# Patient Record
Sex: Male | Born: 1980
Health system: Southern US, Community
[De-identification: ages and names within clinical notes are randomized; demographics above are authoritative.]

## PROBLEM LIST (undated history)

## (undated) DIAGNOSIS — Z973 Presence of spectacles and contact lenses: Secondary | ICD-10-CM

## (undated) DIAGNOSIS — I10 Essential (primary) hypertension: Secondary | ICD-10-CM

## (undated) DIAGNOSIS — G4700 Insomnia, unspecified: Secondary | ICD-10-CM

## (undated) HISTORY — PX: MANDIBLE FRACTURE SURGERY: SHX706

## (undated) HISTORY — DX: Insomnia, unspecified: G47.00

## (undated) HISTORY — DX: Essential (primary) hypertension: I10

## (undated) HISTORY — DX: Presence of spectacles and contact lenses: Z97.3

## (undated) HISTORY — PX: KNEE SURGERY: SHX244

---

## 2005-08-04 ENCOUNTER — Emergency Department (HOSPITAL_COMMUNITY): Admission: EM | Admit: 2005-08-04 | Discharge: 2005-08-04 | Payer: Self-pay | Admitting: Emergency Medicine

## 2005-08-06 ENCOUNTER — Ambulatory Visit (HOSPITAL_BASED_OUTPATIENT_CLINIC_OR_DEPARTMENT_OTHER): Admission: RE | Admit: 2005-08-06 | Discharge: 2005-08-06 | Payer: Self-pay | Admitting: *Deleted

## 2007-04-11 ENCOUNTER — Emergency Department (HOSPITAL_COMMUNITY): Admission: EM | Admit: 2007-04-11 | Discharge: 2007-04-11 | Payer: Self-pay | Admitting: Emergency Medicine

## 2008-02-07 ENCOUNTER — Emergency Department (HOSPITAL_COMMUNITY): Admission: EM | Admit: 2008-02-07 | Discharge: 2008-02-07 | Payer: Self-pay | Admitting: Emergency Medicine

## 2008-02-26 ENCOUNTER — Ambulatory Visit (HOSPITAL_BASED_OUTPATIENT_CLINIC_OR_DEPARTMENT_OTHER): Admission: RE | Admit: 2008-02-26 | Discharge: 2008-02-26 | Payer: Self-pay | Admitting: Orthopedic Surgery

## 2009-10-10 ENCOUNTER — Emergency Department (HOSPITAL_COMMUNITY): Admission: EM | Admit: 2009-10-10 | Discharge: 2009-10-10 | Payer: Self-pay | Admitting: Emergency Medicine

## 2009-10-29 ENCOUNTER — Emergency Department (HOSPITAL_COMMUNITY): Admission: EM | Admit: 2009-10-29 | Discharge: 2009-10-29 | Payer: Self-pay | Admitting: Emergency Medicine

## 2010-07-23 ENCOUNTER — Encounter: Payer: Self-pay | Admitting: Orthopedic Surgery

## 2010-11-13 NOTE — Op Note (Signed)
Adrian Hess, Adrian Hess                 ACCOUNT NO.:  [id number]   MEDICAL RECORD NO.:  [id number]          PATIENT TYPE:  AMB   LOCATION:  DSC                          FACILITY:  [facility]   PHYSICIAN:  Eulas Post, MD    DATE OF BIRTH:  1981-02-26   DATE OF PROCEDURE:  02/26/2008  DATE OF DISCHARGE:                               OPERATIVE REPORT   PREOPERATIVE DIAGNOSIS:  Left knee medial meniscus tear.   POSTOPERATIVE DIAGNOSIS:  Left knee medial meniscus tear.   OPERATIVE PROCEDURE:  Left knee arthroscopy with partial medial  meniscectomy.   PREOPERATIVE INDICATIONS:  Mr. Adrian Hess is a 30 year old man who tore  his medial meniscus while going into a deep squatted position while  working at [employer].  He presented with an acute bucket-handle meniscus  tear.  He elected to undergo the above-named procedure.  The risks,  benefits, and alternatives including not limited to risks of infection,  bleeding, nerve injury, incomplete healing of the meniscus, recurrent  meniscal tear, cardiopulmonary complications, progression of arthritis,  knee stiffness, loss of function, among others, and he is willing to  proceed.  We had a long discussion about meniscal repair versus  resection and I counseled him that we would do whenever was most  appropriate given the findings of his meniscus intraoperatively.   OPERATIVE FINDINGS:  The lateral femoral condyle and lateral tibial  condyle and lateral meniscus were normal.  The ACL and PCL were intact.  The patellofemoral joint was normal.  The medial femoral condyle and  medial tibial condyle were essentially normal.  There was a large  displaced bucket-handle meniscus tear.  This tear was basically in the  mid body portion, and there was also a radial parrot-beak type tear at  the mid body as well.  Given the presence of this radial parrot-beak  tear, I did not feel that repair of the meniscus would be warranted, as  well as the fact that there  appeared to be poor blood supply to the  region where the tear actually existed.  Therefore, a meniscectomy was  performed.   OPERATIVE PROCEDURE:  The patient was brought to the operating room and  placed in a supine position.  General anesthesia was administered.  Left  lower extremity was prepped and draped in the usual sterile fashion.  Diagnostic arthroscopy was carried out with the above-named findings.  The bucket-handle meniscus tear was found to be flipped into the notch.  We switched portals and performed our anterior cut through the bucket-  handle from the lateral portal.  We then went posteriorly and released  it posteriorly and then delivered the meniscus essentially nearly in  entirety as one piece.  We then shaved the posterior horn to smoothen  the meniscal free edge and then removed all the arthroscopic  instruments.  The portals were closed with Monocryl and the knee was  injected with Marcaine.  Steri-Strips were applied followed by sterile  gauze.  The patient was awakened and returned to PACU in stable and  satisfactory condition.  There were no complications.  The patient  tolerated the procedure well.      Eulas Post, MD  Electronically Signed    JPL/MEDQ  D:  02/26/2008  T:  02/27/2008  Job:  [id number]

## 2010-11-16 NOTE — Op Note (Signed)
NAMEMASTON, WIGHT                 ACCOUNT NO.:  [id number]   MEDICAL RECORD NO.:  [id number]          PATIENT TYPE:  AMB   LOCATION:  DSC                          FACILITY:  [facility]   PHYSICIAN:  Lyman Speller, MD       DATE OF BIRTH:  [birth year]   DATE OF PROCEDURE:  08/06/2005  DATE OF DISCHARGE:  08/06/2005                                 OPERATIVE REPORT   SURGEON:  C. Gerrie Nordmann, M.D.   PREOPERATIVE DIAGNOSIS:  Mandibular fracture.   POSTOPERATIVE DIAGNOSIS:  Mandibular fracture.   OPERATION PERFORMED:  Four-screw intermaxillary fixation.   DESCRIPTION OF OPERATION:  The patient was taken to the operating room and  placed on the table in the supine position.  He then underwent induction of  nasotracheal anesthesia without difficulty.  At this point, the face was  prepped and draped in the usual sterile fashion.  Inspection at this point  revealed that the patient could be placed in good occlusion without  significant difficulty.  He then underwent placement of two maxillary  screws.  These were 8-mm screws.  He also underwent placement of two  mandibular screws.  The patient was then placed into intermaxillary  occlusion, and 22-gauge stainless steel wire was used to affix the maxillary  and mandibular screws.  This was noted to give excellent intermaxillary  fixation.  At this point, the patient's face was cleansed and he was  awakened from general anesthesia without difficulty.  The patient was  transferred to the recovery room in stable condition.  The final sponge and  needle count was noted to be correct.  The estimated blood loss was minimal.  The patient will be discharged to home later today barring any unforeseen  complications.      Lyman Speller, MD  Electronically Signed     CWB/MEDQ  D:  10/11/2005  T:  10/11/2005  Job:  [id number]

## 2011-04-11 LAB — CBC
HCT: 42.6
Hemoglobin: 15.1
MCHC: 35.3
Platelets: 187
WBC: 9.9

## 2011-04-11 LAB — BASIC METABOLIC PANEL
Calcium: 9.3
GFR calc Af Amer: >60
GFR calc non Af Amer: >60
Glucose, Bld: 107 — ABNORMAL HIGH
Potassium: 3.6

## 2011-04-11 LAB — DIFFERENTIAL
Basophils Absolute: 0
Basophils Relative: 0
Eosinophils Absolute: 0
Lymphocytes Relative: 16
Neutro Abs: 7.5

## 2011-04-11 LAB — POCT CARDIAC MARKERS
CKMB, poc: 2.4
Troponin i, poc: <0.05

## 2011-12-27 ENCOUNTER — Encounter (HOSPITAL_COMMUNITY): Payer: Self-pay | Admitting: *Deleted

## 2011-12-27 ENCOUNTER — Emergency Department (HOSPITAL_COMMUNITY): Payer: Medicaid Other

## 2011-12-27 ENCOUNTER — Emergency Department (HOSPITAL_COMMUNITY)
Admission: EM | Admit: 2011-12-27 | Discharge: 2011-12-27 | Disposition: A | Discharge status: Home / Self Care | Payer: Medicaid Other | Attending: Emergency Medicine | Admitting: Emergency Medicine

## 2011-12-27 DIAGNOSIS — M25569 Pain in unspecified knee: Secondary | ICD-10-CM | POA: Insufficient documentation

## 2011-12-27 MED ORDER — OXYCODONE-ACETAMINOPHEN 5-325 MG PO TABS
2.0000 | ORAL_TABLET | Freq: Once | ORAL | Status: AC
  Administered 2011-12-27: 2 via ORAL
  Filled 2011-12-27: qty 2

## 2011-12-27 MED ORDER — HYDROCODONE-ACETAMINOPHEN 5-325 MG PO TABS: 2.0000 | ORAL_TABLET | ORAL | Status: AC | PRN

## 2011-12-27 NOTE — ED Provider Notes (Signed)
History     CSN: [id number]  Arrival date & time 12/27/11  3086   First MD Initiated Contact with Patient 12/27/11 [phone]      Chief Complaint  Patient presents with  . Knee Pain    (Consider location/radiation/quality/duration/timing/severity/associated sxs/prior treatment) HPI Comments: Patient reports that last evening he began having pain of his right knee while playing basketball.  He states that the pain is located medial to the patella.  He reports that there was not a specific injury.  He did not not notice the pain until after the game.  He reports that there was some swelling initially, but the swelling has improved.  No erythema or warmth.  No numbness or tingling.  He reports that 5 years ago he had surgery done on that knee for a torn meniscus.  Patient is a 31 y.o. male presenting with knee pain. The history is provided by the patient.  Knee Pain This is a new problem. The current episode started yesterday. The problem occurs constantly. The problem has been unchanged. Associated symptoms include joint swelling. Pertinent negatives include no chills, fever, nausea, numbness or vomiting. The symptoms are aggravated by bending and walking. He has tried NSAIDs for the symptoms.    History reviewed. No pertinent past medical history.  Past Surgical History  Procedure Date  . Knee surgery   . Mandible fracture surgery     No family history on file.  History  Substance Use Topics  . Smoking status: Never Smoker   . Smokeless tobacco: Not on file  . Alcohol Use: 6.0 oz/week    10 Cans of beer per week      Review of Systems  Constitutional: Negative for fever and chills.  Gastrointestinal: Negative for nausea and vomiting.  Musculoskeletal: Positive for joint swelling.  Skin: Negative for color change.  Neurological: Negative for numbness.    Allergies  Review of patient's allergies indicates no known allergies.  Home Medications  No current outpatient  prescriptions on file.  BP 138/93  Pulse 98  Temp 98.9 F (37.2 C) (Oral)  Resp 18  SpO2 99%  Physical Exam  Nursing note and vitals reviewed. Constitutional: He appears well-developed and well-nourished.  Cardiovascular: Normal rate, regular rhythm and normal heart sounds.   Pulses:      Dorsalis pedis pulses are 2+ on the right side, and 2+ on the left side.  Pulmonary/Chest: Effort normal and breath sounds normal.  Musculoskeletal:       Right knee: He exhibits no swelling, no effusion, no ecchymosis, no deformity, no erythema, no LCL laxity, normal meniscus and no MCL laxity. tenderness found. Medial joint line tenderness noted.       Pain with ROM of right knee  Neurological: He is alert. No sensory deficit.       Patient refused to ambulate due to pain.  Skin: Skin is warm and dry.  Psychiatric: He has a normal mood and affect.    ED Course  Procedures (including critical care time)  Labs Reviewed - No data to display Dg Knee Complete 4 Views Right  12/27/2011  *RADIOLOGY REPORT*  Clinical Data: Knee pain.  RIGHT KNEE - COMPLETE 4+ VIEW  Comparison: 02/10/2008 MRI.  Findings: No acute bony abnormality.  Specifically, no fracture, subluxation, or dislocation.  Soft tissues are intact.  No joint effusion.  Normal mineralization.  Joint spaces are maintained.  IMPRESSION: No acute bony abnormality.  Original Report Authenticated By: Cyndie Chime, M.D.  1. Knee pain       MDM  Patient with knee pain since yesterday.  No erythema, swelling, or warmth.  Negative xray.  Neurovascularly intact.  Patient given knee brace and crutches.  Patient instructed to follow up with Orthopedics next week if pain continues.        Pascal Lux [place], PA-C 12/27/11 1552

## 2011-12-27 NOTE — Discharge Instructions (Signed)
Be sure to read and understand instructions below prior to leaving the hospital. If your symptoms persist without any improvement in 1 week it is reccommended that you follow up with orthopedics listed above. Use your pain medication as prescribed for severe pain and do not operate heavy machinery while on pain medication. Note that your pain medication contains acetaminophen (Tylenol) & its is not reccommended that you use additional acetaminophen (Tylenol) while taking this medication.  Take Ibuprofen for pain.   Only take Hydrocodone for severe pain.   TREATMENT  Rest, ice, elevation, and compression are the basic modes of treatment.   Apply ice to the sore area for 15 to 20 minutes, 3 to 4 times per day. Do this while you are awake for the first 2 days, or as directed. This can be stopped when the swelling goes away. Put the ice in a plastic bag and place a towel between the bag of ice and your skin.  Keep your leg elevated when possible to lessen swelling.  If your caregiver recommends crutches, use them as instructed for 1 week. Then, you may walk as tolerated.  Do not drive a vehicle on pain medication. ACTIVITY:            - Weight bearing as tolerated            - Exercises should be limited to pain free range of motion  Knee Immobilization:: This is used to support and protect an injured or painful knee. Knee immobilizers keep your knee from being used while it is healing.  Use powder to control irritation from sweat and friction.  Adjust the immobilizer to be firm but not tight. Signs of an immobilizer that is too tight include:   Swelling.   Numbness.   Color change in your foot or ankle.   Increased pain.  While resting, raise your leg above the level of your heart. This reduces throbbing and helps healing. Prop it up with pillows.  Remove the immobilizer to bathe and sleep. Wear it other times until you see your doctor again.               SEEK MEDICAL CARE IF:  You have an  increase in bruising, swelling, or pain.  Your toes feel cold.  Pain relief is not achieved with medications.  EMERGENCY:: Your toes are numb or blue or you have severe pain.  You notice redness, swelling, warmth or increasing pain in your knee.  An unexplained oral temperature above 102 F (38.9 C) develops.  COLD THERAPY DIRECTIONS:  Ice or gel packs can be used to reduce both pain and swelling. Ice is the most helpful within the first 24 to 48 hours after an injury or flareup from overusing a muscle or joint.  Ice is effective, has very few side effects, and is safe for most people to use.   If you expose your skin to cold temperatures for too long or without the proper protection, you can damage your skin or nerves. Watch for signs of skin damage due to cold.   HOME CARE INSTRUCTIONS  Follow these tips to use ice and cold packs safely.  Place a dry or damp towel between the ice and skin. A damp towel will cool the skin more quickly, so you may need to shorten the time that the ice is used.  For a more rapid response, add gentle compression to the ice.  Ice for no more than 10 to 20 minutes  at a time. The bonier the area you are icing, the less time it will take to get the benefits of ice.  Check your skin after 5 minutes to make sure there are no signs of a poor response to cold or skin damage.  Rest 20 minutes or more in between uses.  Once your skin is numb, you can end your treatment. You can test numbness by very lightly touching your skin. The touch should be so light that you do not see the skin dimple from the pressure of your fingertip. When using ice, most people will feel these normal sensations in this order: cold, burning, aching, and numbness.  Do not use ice on someone who cannot communicate their responses to pain, such as small children or people with dementia.   HOW TO MAKE AN ICE PACK  To make an ice pack, do one of the following:  Place crushed ice or a bag of frozen  vegetables in a sealable plastic bag. Squeeze out the excess air. Place this bag inside another plastic bag. Slide the bag into a pillowcase or place a damp towel between your skin and the bag.  Mix 3 parts water with 1 part rubbing alcohol. Freeze the mixture in a sealable plastic bag. When you remove the mixture from the freezer, it will be slushy. Squeeze out the excess air. Place this bag inside another plastic bag. Slide the bag into a pillowcase or place a damp towel between your s

## 2011-12-27 NOTE — ED Provider Notes (Signed)
Medical screening examination/treatment/procedure(s) were performed by non-physician practitioner and as supervising physician I was immediately available for consultation/collaboration.  Doug Sou, MD 12/27/11 [phone]

## 2011-12-27 NOTE — ED Notes (Signed)
Pt reports he had a meniscus surgery 5 to 6 years ago on right leg. Pt played basketball yesterday and denies injury, but noted pain after and applied ice and icy hot.  Pt reports he went out last night and drank beer which improved his pain and then when he got up this AM pt reports pain was much more severe. Pt reports pain on lateral and medial knee when not standing, but pain around entire knee with weight or walking.

## 2013-01-19 ENCOUNTER — Encounter: Payer: Self-pay | Admitting: Medical

## 2013-01-19 ENCOUNTER — Ambulatory Visit (INDEPENDENT_AMBULATORY_CARE_PROVIDER_SITE_OTHER): Payer: BC Managed Care – PPO | Admitting: Medical

## 2013-01-19 ENCOUNTER — Other Ambulatory Visit: Payer: Self-pay | Admitting: Medical

## 2013-01-19 VITALS — BP 130/88 | HR 60 | Temp 98.0°F | Resp 16 | Ht 73.0 in | Wt 244.0 lb

## 2013-01-19 DIAGNOSIS — Z23 Encounter for immunization: Secondary | ICD-10-CM

## 2013-01-19 DIAGNOSIS — B360 Pityriasis versicolor: Secondary | ICD-10-CM

## 2013-01-19 DIAGNOSIS — G4700 Insomnia, unspecified: Secondary | ICD-10-CM

## 2013-01-19 DIAGNOSIS — Z0000 Encounter for general adult medical examination without abnormal findings: Secondary | ICD-10-CM

## 2013-01-19 DIAGNOSIS — F430 Acute stress reaction: Secondary | ICD-10-CM

## 2013-01-19 LAB — POCT URINALYSIS DIPSTICK
Bilirubin, UA: NEGATIVE
Blood, UA: NEGATIVE
Ketones, UA: NEGATIVE
Leukocytes, UA: NEGATIVE
pH, UA: 5

## 2013-01-19 LAB — CBC WITH DIFFERENTIAL/PLATELET
Basophils Absolute: 0 10*3/uL (ref 0.0–0.1)
Lymphs Abs: 3.1 10*3/uL (ref 0.7–4.0)
Monocytes Absolute: 0.6 10*3/uL (ref 0.1–1.0)
Monocytes Relative: 8 % (ref 3–12)
Neutro Abs: 4.2 10*3/uL (ref 1.7–7.7)
Neutrophils Relative %: 51 % (ref 43–77)
RBC: 4.9 MIL/uL (ref 4.22–5.81)

## 2013-01-19 LAB — COMPREHENSIVE METABOLIC PANEL
ALT: 35 U/L (ref 0–53)
AST: 23 U/L (ref 0–37)
Alkaline Phosphatase: 80 U/L (ref 39–117)
BUN: 12 mg/dL (ref 6–23)
CO2: 27 mEq/L (ref 19–32)
Calcium: 9.6 mg/dL (ref 8.4–10.5)
Total Bilirubin: 0.9 mg/dL (ref 0.3–1.2)
Total Protein: 7.2 g/dL (ref 6.0–8.3)

## 2013-01-19 LAB — LIPID PANEL
LDL Cholesterol: 110 mg/dL — ABNORMAL HIGH (ref 0–99)
Total CHOL/HDL Ratio: 5.1 Ratio
Triglycerides: 243 mg/dL — ABNORMAL HIGH (ref <150)

## 2013-01-19 MED ORDER — KETOCONAZOLE 2 % EX SHAM: MEDICATED_SHAMPOO | CUTANEOUS | Status: DC

## 2013-01-19 NOTE — Patient Instructions (Signed)
Begin Nizoral shampoo.  Lather this on 2 days per week, leave on for 10-20 minutes on scalp and neck and back, then wash off with water.   It may take a few weeks for this to resolve.   Insomnia - try some OTC melatonin once daily, 1mg  daily.  Other options include Benadryl or Zquil OTC as a sleep aid.  Insomnia Insomnia is frequent trouble falling and/or staying asleep. Insomnia can be a long term problem or a short term problem. Both are common. Insomnia can be a short term problem when the wakefulness is related to a certain stress or worry. Long term insomnia is often related to ongoing stress during waking hours and/or poor sleeping habits. Overtime, sleep deprivation itself can make the problem worse. Every little thing feels more severe because you are overtired and your ability to cope is decreased.  CAUSES   Stress, anxiety, and depression.  Poor sleeping habits.  Distractions such as TV in the bedroom.  Naps close to bedtime.  Engaging in emotionally charged conversations before bed.  Technical reading before sleep.  Alcohol and other sedatives. They may make the problem worse. They can hurt normal sleep patterns and normal dream activity.  Stimulants such as caffeine for several hours prior to bedtime.  Pain syndromes and shortness of breath can cause insomnia.  Exercise late at night.  Changing time zones may cause sleeping problems (jet lag).  It is sometimes helpful to have someone observe your sleeping patterns. They should look for periods of not breathing during the night (sleep apnea). They should also look to see how long those periods last. If you live alone or observers are uncertain, you can also be observed at a sleep clinic where your sleep patterns will be professionally monitored. Sleep apnea requires a checkup and treatment. Give your caregivers your medical history. Give your caregivers observations your family has made about your sleep.   SYMPTOMS   Not  feeling rested in the morning.  Anxiety and restlessness at bedtime.  Difficulty falling and staying asleep.  TREATMENT   Your caregiver may prescribe treatment for an underlying medical disorders. Your caregiver can give advice or help if you are using alcohol or other drugs for self-medication. Treatment of underlying problems will usually eliminate insomnia problems.  Medications can be prescribed for short time use. They are generally not recommended for lengthy use.  Over-the-counter sleep medicines are not recommended for lengthy use. They can be habit forming.  You can promote easier sleeping by making lifestyle changes such as the following:  Sleep hygiene  Sleep only as much as you need to feel rested and then get out of bed  Keep a regular sleep schedule.  Aim to go to bed at the same time every night, and set an alarm clock to wake up at a fixed time each morning including weekends  Develop a bedtime ritual. Keep a familiar routine of bathing, brushing your teeth, climbing into bed at the same time each night, listening to soothing music. Routines increase the success of falling to sleep faster.  Use relaxation techniques. This can be using breathing and muscle tension release routines. It can also include visualizing peaceful scenes. You can also help control troubling or intruding thoughts by keeping your mind occupied with boring or repetitive thoughts like the old concept of counting sheep. You can make it more creative like imagining planting one beautiful flower after another in your backyard garden.  During your day, work to eliminate  stress. When this is not possible use some of the previous suggestions to help reduce the anxiety that accompanies stressful situations.  Avoid forcing sleep  Exercise regularly at least 20 minutes, preferably 4-5 hours before bedtime  Avoid caffeinated beverages after lunch  Avoid alcohol near bedtime; no "night cap"  Avoid  smoking, especially in the evening  Do not go to bed hungry; work on changing your diet and the time of your last meal. No night time snacks.  Adjust bedroom environment to a cool, quiet, dark room  Deal with you worries before bedtime.  Consider counseling for excessive stress, worry, and life situations.  I can provide resources and contact information for counselors if needed.  Stimulus control  Go to bed only when sleepy  Do not watch television, read, eat, or worry while in bed.  Use bed only for sleep and sex  Stop tedious detailed work at least one hour before bedtime.  Get out of the bed if unable to fall asleep within 20 minutes and go to another room.  Return to bed only when sleepy.  Read or do some quiet activity. Keep the lights down. Wait until you feel sleepy and go back to bed.Repeat this step as many times as necessary throughout the night  Do not take a nap during the day   SLEEP DIARY   Keep a diary. Inform your caregiver about your progress. This includes any medication side effects. See your caregiver regularly. Take note of:  Times when you are asleep.  Times when you are awake during the night.  The quality of your sleep.  How you feel the next day.  This information will help your caregiver care for you.  Bring your sleep diary in at the next visit    Anxiety and Panic Attacks Your caregiver has informed you that you are having an anxiety or panic attack. There may be many forms of this. Most of the time these attacks come suddenly and without warning. They come at any time of day, including periods of sleep, and at any time of life. They may be strong and unexplained. Although panic attacks are very scary, they are physically harmless. Sometimes the cause of your anxiety is not known. Anxiety is a protective mechanism of the body in its fight or flight mechanism. Most of these perceived danger situations are actually nonphysical situations (such as  anxiety over losing a job). CAUSES  The causes of an anxiety or panic attack are many. Panic attacks may occur in otherwise healthy people given a certain set of circumstances. There may be a genetic cause for panic attacks. Some medications may also have anxiety as a side effect. SYMPTOMS  Some of the most common feelings are:  Intense terror.  Dizziness, feeling faint.  Hot and cold flashes.  Fear of going crazy.  Feelings that nothing is real.  Sweating.  Shaking.  Chest pain or a fast heartbeat (palpitations).  Smothering, choking sensations.  Feelings of impending doom and that death is near.  Tingling of extremities, this may be from over-breathing.  Altered reality (derealization).  Being detached from yourself (depersonalization). Several symptoms can be present to make up anxiety or panic attacks. DIAGNOSIS  The evaluation by your caregiver will depend on the type of symptoms you are experiencing. The diagnosis of anxiety or panic attack is made when no physical illness can be determined to be a cause of the symptoms. TREATMENT  Treatment to prevent anxiety and panic attacks  may include:  Avoidance of circumstances that cause anxiety.  Reassurance and relaxation.  Regular exercise.  Relaxation therapies, such as yoga.  Psychotherapy with a psychiatrist or therapist.  Avoidance of caffeine, alcohol and illegal drugs.  Prescribed medication. SEEK IMMEDIATE MEDICAL CARE IF:   You experience panic attack symptoms that are different than your usual symptoms.  You have any worsening or concerning symptoms. Document Released: 06/17/2005 Document Revised: 09/09/2011 Document Reviewed: 10/19/2009 [facility] Patient Information 2014 [place], [place].    Tinea Versicolor Tinea versicolor is a common yeast infection of the skin. This condition becomes known when the yeast on our skin starts to overgrow (yeast is a normal inhabitant on our skin). This condition is  noticed as white or light brown patches on brown skin, and is more evident in the summer on tanned skin. These areas are slightly scaly if scratched. The light patches from the yeast become evident when the yeast creates "holes in your suntan". This is most often noticed in the summer. The patches are usually located on the chest, back, pubis, neck and body folds. However, it may occur on any area of body. Mild itching and inflammation (redness or soreness) may be present. DIAGNOSIS  The diagnosisof this is made clinically (by looking). Cultures from samples are usually not needed. Examination under the microscope may help. However, yeast is normally found on skin. The diagnosis still remains clinical. Examination under Wood's Ultraviolet Light can determine the extent of the infection. TREATMENT  This common infection is usually only of cosmetic (only a concern to your appearance). It is easily treated with dandruff shampoo used during showers or bathing. Vigorous scrubbing will eliminate the yeast over several days time. The light areas in your skin may remain for weeks or months after the infection is cured unless your skin is exposed to sunlight. The lighter or darker spots caused by the fungus that remain after complete treatment are not a sign of treatment failure; it will take a long time to resolve. Your caregiver may recommend a number of commercial preparations or medication by mouth if home care is not working. Recurrence is common and preventative medication may be necessary. This skin condition is not highly contagious. Special care is not needed to protect close friends and family members. Normal hygiene is usually enough. Follow up is required only if you develop complications (such as a secondary infection from scratching), if recommended by your caregiver, or if no relief is obtained from the preparations used. Document Released: 06/14/2000 Document Revised: 09/09/2011 Document Reviewed:  07/27/2008 [facility] Patient Information 2014 [place], [place].

## 2013-01-19 NOTE — Progress Notes (Signed)
Subjective:   HPI  Adrian Hess is a 32 y.o. male who presents for a complete physical.  New patient today.   Last routine care, years ago.   Preventative care: Last ophthalmology visit: sees yearly Last dental visit: sees yearly  Concerns: Sleep - so much on his mind, can't get to sleep.   Whole family has insomnia problems.  No prior treatment.    Feels like he is having problems with anxiety.  Feels scattered brained.  Has a lot going on.  Transitioning to a job in [place], having to move later this year.   Going to gym helps him focus.  No prior evaluation with psychiatry or counseling.   Marriage is fine.   Not down and depressed.   Expecting another son.   Has a 3yo and 8yo.  No prior treatment for anxiety.  Reviewed their medical, surgical, family, social, medication, and allergy history and updated chart as appropriate.   Past Medical History  Diagnosis Date  . Wears contact lenses   . Insomnia     Past Surgical History  Procedure Laterality Date  . Knee surgery      right meniscus  . Mandible fracture surgery      Family History  Problem Relation Age of Onset  . Hypertension Mother   . Diabetes Mother   . Anxiety disorder Mother   . Depression Mother   . Diabetes Father   . Diabetes Sister   . Anxiety disorder Sister   . Cancer Maternal Grandmother   . Heart disease Neg Hx     History   Social History  . Marital Status: Single    Spouse Name: N/A    Number of Children: N/A  . Years of Education: N/A   Occupational History  . Not on file.   Social History Main Topics  . Smoking status: Current Some Day Smoker    Types: Cigars  . Smokeless tobacco: Not on file  . Alcohol Use: 6.0 oz/week    10 Cans of beer per week  . Drug Use: Yes    Special: Marijuana  . Sexually Active: Not on file   Other Topics Concern  . 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   No current facility-administered medications on file prior to visit.    No Known Allergies   Review of Systems Constitutional: -fever, -chills, -sweats, -unexpected weight change, -decreased appetite, -fatigue Allergy: -sneezing, -itching, -congestion Dermatology: -changing moles, --rash, -lumps ENT: -runny nose, -ear pain, -sore throat, -hoarseness, -sinus pain, -teeth pain, - ringing in ears, -hearing loss, -nosebleeds Cardiology: -chest pain, -palpitations, -swelling, -difficulty breathing when lying flat, -waking up short of breath Respiratory: -cough, -shortness of breath, -difficulty breathing with exercise or exertion, -wheezing, -coughing up blood Gastroenterology: -abdominal pain, -nausea, -vomiting, -diarrhea, -constipation, -blood in stool, -changes in bowel movement, -difficulty swallowing or eating Hematology: -bleeding, -bruising  Musculoskeletal: +joint aches, -muscle aches, -joint swelling, -back pain, -neck pain, -cramping, -changes in gait Ophthalmology: denies vision changes, eye redness, itching, discharge Urology: -burning with urination, -difficulty urinating, -blood in urine, -urinary frequency, -urgency, -incontinence Neurology: +headache, -weakness, -tingling, -numbness, -memory loss, -falls, -dizziness Psychology: -depressed mood, -agitation, -sleep problems     Objective:   Physical Exam  Vitals and nurse notes reviewed  General appearance: alert,  no distress, WD/WN, male Skin: several hypopigmented rough patches, circular scattered throughout scalp, neck and back, c/w tinea versicolor, scattered benign appearing macules HEENT: normocephalic, conjunctiva/corneas normal, sclerae anicteric, PERRLA, EOMi, nares patent, no discharge or erythema, pharynx normal Oral cavity: MMM, tongue normal, teeth in good repair Neck: supple, no lymphadenopathy, no  thyromegaly, no masses, normal ROM Chest: non tender, normal shape and expansion Heart: RRR, normal S1, S2, no murmurs Lungs: CTA bilaterally, no wheezes, rhonchi, or rales Abdomen: +bs, soft, non tender, non distended, no masses, no hepatomegaly, no splenomegaly, no bruits Back: non tender, normal ROM, no scoliosis Musculoskeletal: upper extremities non tender, no obvious deformity, normal ROM throughout, right knee with small port surgical scars, otherwise lower extremities non tender, no obvious deformity, normal ROM throughout Extremities: no edema, no cyanosis, no clubbing Pulses: 2+ symmetric, upper and lower extremities, normal cap refill Neurological: alert, oriented x 3, CN2-12 intact, strength normal upper extremities and lower extremities, sensation normal throughout, DTRs 2+ throughout, no cerebellar signs, gait normal Psychiatric: normal affect, behavior normal, pleasant  GU: normal male external genitalia, uncircumcised, nontender, no masses, no hernia, no lymphadenopathy Rectal: deferred   Assessment and Plan :      Encounter Diagnoses  Name Primary?  . Routine general medical examination at a health care facility Yes  . Acute stress reaction   . Tinea versicolor   . Need for Tdap vaccination   . Insomnia     Physical exam - discussed healthy lifestyle, diet, exercise, preventative care, vaccinations, and addressed their concerns.   Acute stress reaction - counseled on ways to deal with his stressors, most of which are good stressors Tinea versicolor - discussed diagnosis, treatment, begin Nizoral tdap vaccine, VIS and counseling given Insomnia - discussed sleep hygiene, begin OTC Sleep aid prn or melatonin. Follow-up pending labs.

## 2013-01-20 LAB — TSH: TSH: 5.152 u[IU]/mL — ABNORMAL HIGH (ref 0.350–4.500)

## 2013-08-23 DIAGNOSIS — H53149 Visual discomfort, unspecified: Secondary | ICD-10-CM | POA: Insufficient documentation

## 2013-08-23 DIAGNOSIS — R209 Unspecified disturbances of skin sensation: Secondary | ICD-10-CM | POA: Insufficient documentation

## 2013-08-23 DIAGNOSIS — G44209 Tension-type headache, unspecified, not intractable: Secondary | ICD-10-CM | POA: Insufficient documentation

## 2013-08-23 DIAGNOSIS — F17200 Nicotine dependence, unspecified, uncomplicated: Secondary | ICD-10-CM | POA: Insufficient documentation

## 2013-08-23 DIAGNOSIS — R4789 Other speech disturbances: Secondary | ICD-10-CM | POA: Insufficient documentation

## 2013-08-23 DIAGNOSIS — R112 Nausea with vomiting, unspecified: Secondary | ICD-10-CM | POA: Insufficient documentation

## 2013-08-23 DIAGNOSIS — R42 Dizziness and giddiness: Secondary | ICD-10-CM | POA: Insufficient documentation

## 2013-08-24 ENCOUNTER — Emergency Department (HOSPITAL_COMMUNITY)
Admission: EM | Admit: 2013-08-24 | Discharge: 2013-08-24 | Disposition: A | Discharge status: Home / Self Care | Payer: Medicaid Other | Attending: Emergency Medicine | Admitting: Emergency Medicine

## 2013-08-24 ENCOUNTER — Emergency Department (HOSPITAL_COMMUNITY): Payer: Medicaid Other

## 2013-08-24 ENCOUNTER — Encounter (HOSPITAL_COMMUNITY): Payer: Self-pay | Admitting: Emergency Medicine

## 2013-08-24 DIAGNOSIS — G44209 Tension-type headache, unspecified, not intractable: Secondary | ICD-10-CM

## 2013-08-24 LAB — CBC WITH DIFFERENTIAL/PLATELET
Basophils Absolute: 0 10*3/uL (ref 0.0–0.1)
Basophils Relative: 0 % (ref 0–1)
EOS ABS: 0.1 10*3/uL (ref 0.0–0.7)
EOS PCT: 2 % (ref 0–5)
HCT: 38.9 % — ABNORMAL LOW (ref 39.0–52.0)
Hemoglobin: 13.5 g/dL (ref 13.0–17.0)
LYMPHS PCT: 34 % (ref 12–46)
Lymphs Abs: 3 10*3/uL (ref 0.7–4.0)
MCH: 30.6 pg (ref 26.0–34.0)
MCHC: 34.7 g/dL (ref 30.0–36.0)
MCV: 88.2 fL (ref 78.0–100.0)
MONO ABS: 0.6 10*3/uL (ref 0.1–1.0)
Monocytes Relative: 7 % (ref 3–12)
NEUTROS ABS: 5.1 10*3/uL (ref 1.7–7.7)
NEUTROS PCT: 58 % (ref 43–77)
PLATELETS: 176 10*3/uL (ref 150–400)
RBC: 4.41 MIL/uL (ref 4.22–5.81)
RDW: 12.4 % (ref 11.5–15.5)
WBC: 8.9 10*3/uL (ref 4.0–10.5)

## 2013-08-24 LAB — I-STAT CHEM 8, ED
BUN: 9 mg/dL (ref 6–23)
CHLORIDE: 102 meq/L (ref 96–112)
Calcium, Ion: 1.18 mmol/L (ref 1.12–1.23)
Creatinine, Ser: 1 mg/dL (ref 0.50–1.35)
Glucose, Bld: 75 mg/dL (ref 70–99)
HEMATOCRIT: 42 % (ref 39.0–52.0)
HEMOGLOBIN: 14.3 g/dL (ref 13.0–17.0)
POTASSIUM: 3.9 meq/L (ref 3.7–5.3)
Sodium: 141 mEq/L (ref 137–147)
TCO2: 26 mmol/L (ref 0–100)

## 2013-08-24 LAB — URINALYSIS, ROUTINE W REFLEX MICROSCOPIC
BILIRUBIN URINE: NEGATIVE
GLUCOSE, UA: NEGATIVE mg/dL
HGB URINE DIPSTICK: NEGATIVE
Ketones, ur: NEGATIVE mg/dL
LEUKOCYTES UA: NEGATIVE
Nitrite: NEGATIVE
PH: 6 (ref 5.0–8.0)
PROTEIN: NEGATIVE mg/dL
SPECIFIC GRAVITY, URINE: 1.026 (ref 1.005–1.030)
Urobilinogen, UA: 0.2 mg/dL (ref 0.0–1.0)

## 2013-08-24 MED ORDER — ONDANSETRON 4 MG PO TBDP
4.0000 mg | ORAL_TABLET | Freq: Once | ORAL | Status: AC
  Administered 2013-08-24: 4 mg via ORAL
  Filled 2013-08-24: qty 1

## 2013-08-24 MED ORDER — KETOROLAC TROMETHAMINE 60 MG/2ML IM SOLN
30.0000 mg | Freq: Once | INTRAMUSCULAR | Status: AC
  Administered 2013-08-24: 30 mg via INTRAMUSCULAR
  Filled 2013-08-24: qty 2

## 2013-08-24 MED ORDER — CYCLOBENZAPRINE HCL 5 MG PO TABS: 5.0000 mg | ORAL_TABLET | Freq: Three times a day (TID) | ORAL | Status: DC | PRN

## 2013-08-24 MED ORDER — NAPROXEN 500 MG PO TABS: 500.0000 mg | ORAL_TABLET | Freq: Two times a day (BID) | ORAL | Status: DC

## 2013-08-24 NOTE — ED Provider Notes (Signed)
Medical screening examination/treatment/procedure(s) were performed by non-physician practitioner and as supervising physician I was immediately available for consultation/collaboration.    Channin Agustin, MD 08/24/13 0709 

## 2013-08-24 NOTE — ED Provider Notes (Signed)
CSN: [id number]     Arrival date & time 08/23/13  2350 History   First MD Initiated Contact with Patient 08/24/13 0037     Chief Complaint  Patient presents with  . Headache     (Consider location/radiation/quality/duration/timing/severity/associated sxs/prior Treatment) HPI Comments: For the past 2, days.  Patient reports, that he's had recurrent headaches on the right side.  Muscle tension in the back of his neck.  These are associated with numbness to the right side of his head, with photophobia, visual disturbances, nausea, with one episode of vomiting, and slurring of his speech. He has no previous history of headaches.  He has a family history of hypertension, and strokes, and diabetes He took a Vicodin at 10 mg tablet before he arrived in the emergency department.  His headache has, resolved, but wishes to continue with evaluation for potential causes  The history is provided by the patient.    Past Medical History  Diagnosis Date  . Wears contact lenses   . Insomnia    Past Surgical History  Procedure Laterality Date  . Knee surgery      right meniscus  . Mandible fracture surgery     Family History  Problem Relation Age of Onset  . Hypertension Mother   . Diabetes Mother   . Anxiety disorder Mother   . Depression Mother   . Diabetes Father   . Diabetes Sister   . Anxiety disorder Sister   . Cancer Maternal Grandmother   . Heart disease Neg Hx    History  Substance Use Topics  . Smoking status: Current Some Day Smoker    Types: Cigars  . Smokeless tobacco: Not on file  . Alcohol Use: 3.0 oz/week    5 Cans of beer per week    Review of Systems  Constitutional: Negative for fever and chills.  Eyes: Positive for photophobia and visual disturbance.  Gastrointestinal: Positive for vomiting.  Skin: Negative for rash.  Neurological: Positive for dizziness, speech difficulty, numbness and headaches. Negative for syncope, facial asymmetry and weakness.  All other  systems reviewed and are negative.      Allergies  Review of patient's allergies indicates no known allergies.  Home Medications   Current Outpatient Rx  Name  Route  Sig  Dispense  Refill  . cyclobenzaprine (FLEXERIL) 5 MG tablet   Oral   Take 1 tablet (5 mg total) by mouth 3 (three) times daily as needed for muscle spasms.   30 tablet   0   . naproxen (NAPROSYN) 500 MG tablet   Oral   Take 1 tablet (500 mg total) by mouth 2 (two) times daily.   30 tablet   0    BP 152/96  Pulse 74  Temp(Src) 98.1 F (36.7 C) (Oral)  Resp 18  SpO2 98% Physical Exam  Nursing note and vitals reviewed. Constitutional: He is oriented to person, place, and time. He appears well-developed and well-nourished.  HENT:  Head: Normocephalic.  Right Ear: External ear normal.  Left Ear: External ear normal.  Mouth/Throat: Oropharynx is clear and moist.  Eyes: Pupils are equal, round, and reactive to light.  Neck: Normal range of motion.  Cardiovascular: Normal rate and regular rhythm.   Pulmonary/Chest: Effort normal and breath sounds normal.  Musculoskeletal: He exhibits no edema.  Neurological: He is alert and oriented to person, place, and time. No cranial nerve deficit.  Skin: Skin is warm. No rash noted. No erythema.    ED Course  Procedures (including critical care time) Labs Review Labs Reviewed  CBC WITH DIFFERENTIAL - Abnormal; Notable for the following:    HCT 38.9 (*)    All other components within normal limits  URINALYSIS, ROUTINE W REFLEX MICROSCOPIC  I-STAT CHEM 8, ED   Imaging Review Ct Head Wo Contrast  08/24/2013   CLINICAL DATA:  Headache, right-sided numbness, slurred speech for 2 days. History of mandible fracture.  EXAM: CT HEAD WITHOUT CONTRAST  TECHNIQUE: Contiguous axial images were obtained from the base of the skull through the vertex without intravenous contrast.  COMPARISON:  DG ORTHOPANTOGRAM dated 08/04/2005  FINDINGS: The ventricles and sulci are normal.  No intraparenchymal hemorrhage, mass effect nor midline shift. No acute large vascular territory infarcts.  No abnormal extra-axial fluid collections. Basal cisterns are patent.  No skull fracture. Circumferential right maxillary sinus wall thickening without paranasal sinus air-fluid levels. The mastoid air cells are well aerated. . The included ocular globes and orbital contents are non-suspicious.  IMPRESSION: No acute intracranial process ; normal noncontrast CT of the head.  Chronic right maxillary sinusitis.   Electronically Signed   By: Awilda Metro   On: 08/24/2013 02:12    EKG Interpretation   None      CT scan reviewed.  Labs reviewed that, indicating any particular cause for his headache.  He decreased.  It significant relief with IM Toradol and Zofran.  He'll be discharged home with Flexeril, and Naprosyn  MDM   Final diagnoses:  Muscle tension headache         Arman Filter, NP 08/24/13 4098  Arman Filter, NP 08/24/13 [phone]

## 2013-08-24 NOTE — ED Notes (Signed)
Per pt report: pt c/o headache.  Pt reports HA was bad yesterday and pt reports "sluring a little bit", nauseous, the right side of the body felt numb, and dizzy.  Pt reports that today had similar symptoms earlier but then pt took 400mg  of advil and reports pain and symptoms are better.  Pt is a/o x 4. Pt able to speak in complete sentences without difficulty.  Skin warm and dry.

## 2013-08-24 NOTE — Discharge Instructions (Signed)
Tension Headache A tension headache is pain, pressure, or aching felt over the front and sides of the head. Tension headaches often come after stress, feeling worried (anxiety), or feeling sad or down for a while (depressed). HOME CARE  Only take medicine as told by your doctor.  Lie down in a dark, quiet room when you have a headache.  Keep a journal to find out if certain things bring on headaches. For example, write down:  What you eat and drink.  How much sleep you get.  Any change to your diet or medicines.  Relax by getting a massage or doing other relaxing activities.  Put ice or heat packs on the head and neck area as told by your doctor.  Lessen stress.  Sit up straight. Do not tighten (tense) your muscles.  Quit smoking if you smoke.  Lessen how much alcohol you drink.  Lessen how much caffeine you drink, or stop drinking caffeine.  Eat and exercise regularly.  Get enough sleep.  Avoid using too much pain medicine. GET HELP RIGHT AWAY IF:   Your headache becomes really bad.  You have a fever.  You have a stiff neck.  You have trouble seeing.  Your muscles are weak, or you lose muscle control.  You lose your balance or have trouble walking.  You feel like you will pass out (faint), or you pass out.  You have really bad symptoms that are different than your first symptoms.  You have problems with the medicines given to you by your doctor.  Your medicines do not work.  Your headache feels different than the other headaches.  You feel sick to your stomach (nauseous) or throw up (vomit). MAKE SURE YOU:   Understand these instructions.  Will watch your condition.  Will get help right away if you are not doing well or get worse. Document Released: 09/11/2009 Document Revised: 09/09/2011 Document Reviewed: 06/07/2011 [facility] Patient Information 2014 [employer] [place].  [facility]  Address: [address] [place]  [place]  [id number] Phone: [phone]

## 2015-11-22 ENCOUNTER — Other Ambulatory Visit: Payer: Self-pay | Admitting: Plastic Surgery

## 2015-11-22 ENCOUNTER — Encounter (HOSPITAL_COMMUNITY): Payer: Self-pay | Admitting: *Deleted

## 2015-11-22 ENCOUNTER — Emergency Department (HOSPITAL_COMMUNITY): Payer: Self-pay

## 2015-11-22 ENCOUNTER — Encounter (HOSPITAL_COMMUNITY): Payer: Self-pay | Admitting: Emergency Medicine

## 2015-11-22 ENCOUNTER — Emergency Department (HOSPITAL_COMMUNITY)
Admission: EM | Admit: 2015-11-22 | Discharge: 2015-11-22 | Disposition: A | Discharge status: Home / Self Care | Payer: Self-pay | Attending: Emergency Medicine | Admitting: Emergency Medicine

## 2015-11-22 DIAGNOSIS — Y9289 Other specified places as the place of occurrence of the external cause: Secondary | ICD-10-CM | POA: Insufficient documentation

## 2015-11-22 DIAGNOSIS — Y999 Unspecified external cause status: Secondary | ICD-10-CM | POA: Insufficient documentation

## 2015-11-22 DIAGNOSIS — S0269XB Fracture of mandible of other specified site, initial encounter for open fracture: Secondary | ICD-10-CM | POA: Insufficient documentation

## 2015-11-22 DIAGNOSIS — Y9389 Activity, other specified: Secondary | ICD-10-CM | POA: Insufficient documentation

## 2015-11-22 DIAGNOSIS — F17210 Nicotine dependence, cigarettes, uncomplicated: Secondary | ICD-10-CM | POA: Insufficient documentation

## 2015-11-22 DIAGNOSIS — S02650A Fracture of angle of mandible, unspecified side, initial encounter for closed fracture: Secondary | ICD-10-CM

## 2015-11-22 DIAGNOSIS — S02609B Fracture of mandible, unspecified, initial encounter for open fracture: Secondary | ICD-10-CM

## 2015-11-22 LAB — CBC WITH DIFFERENTIAL/PLATELET
Basophils Absolute: 0 K/uL (ref 0.0–0.1)
Basophils Relative: 0 %
Eosinophils Absolute: 0 K/uL (ref 0.0–0.7)
Eosinophils Relative: 0 %
HCT: 43.4 % (ref 39.0–52.0)
Hemoglobin: 15.1 g/dL (ref 13.0–17.0)
Lymphocytes Relative: 15 %
Lymphs Abs: 1.4 K/uL (ref 0.7–4.0)
MCH: 30.8 pg (ref 26.0–34.0)
MCHC: 34.8 g/dL (ref 30.0–36.0)
MCV: 88.6 fL (ref 78.0–100.0)
Monocytes Absolute: 0.5 K/uL (ref 0.1–1.0)
Monocytes Relative: 5 %
Neutro Abs: 7.6 K/uL (ref 1.7–7.7)
Neutrophils Relative %: 80 %
Platelets: 193 K/uL (ref 150–400)
RBC: 4.9 MIL/uL (ref 4.22–5.81)
RDW: 12.9 % (ref 11.5–15.5)
WBC: 9.5 K/uL (ref 4.0–10.5)

## 2015-11-22 LAB — BASIC METABOLIC PANEL WITH GFR
Anion gap: 7 (ref 5–15)
BUN: 9 mg/dL (ref 6–20)
CO2: 26 mmol/L (ref 22–32)
Calcium: 8.9 mg/dL (ref 8.9–10.3)
Chloride: 106 mmol/L (ref 101–111)
Creatinine, Ser: 0.89 mg/dL (ref 0.61–1.24)
GFR calc Af Amer: >60 mL/min
GFR calc non Af Amer: >60 mL/min
Glucose, Bld: 125 mg/dL — ABNORMAL HIGH (ref 65–99)
Potassium: 4.3 mmol/L (ref 3.5–5.1)
Sodium: 139 mmol/L (ref 135–145)

## 2015-11-22 MED ORDER — LORAZEPAM 2 MG/ML IJ SOLN
0.5000 mg | Freq: Once | INTRAMUSCULAR | Status: AC
  Administered 2015-11-22: 0.5 mg via INTRAVENOUS
  Filled 2015-11-22: qty 1

## 2015-11-22 MED ORDER — MORPHINE SULFATE (PF) 4 MG/ML IV SOLN
4.0000 mg | Freq: Once | INTRAVENOUS | Status: AC
  Administered 2015-11-22: 4 mg via INTRAVENOUS
  Filled 2015-11-22: qty 1

## 2015-11-22 MED ORDER — ONDANSETRON 8 MG PO TBDP: 8.0000 mg | ORAL_TABLET | Freq: Three times a day (TID) | ORAL | Status: DC | PRN

## 2015-11-22 MED ORDER — CEFAZOLIN SODIUM 1-5 GM-% IV SOLN
1.0000 g | Freq: Once | INTRAVENOUS | Status: AC
  Administered 2015-11-22: 1 g via INTRAVENOUS
  Filled 2015-11-22: qty 50

## 2015-11-22 MED ORDER — OXYCODONE-ACETAMINOPHEN 5-325 MG PO TABS: 2.0000 | ORAL_TABLET | ORAL | Status: DC | PRN

## 2015-11-22 MED ORDER — CEFAZOLIN SODIUM-DEXTROSE 2-4 GM/100ML-% IV SOLN
2.0000 g | INTRAVENOUS | Status: DC
  Filled 2015-11-22: qty 100

## 2015-11-22 MED ORDER — HYDROMORPHONE HCL 1 MG/ML IJ SOLN
1.0000 mg | Freq: Once | INTRAMUSCULAR | Status: AC
  Administered 2015-11-22: 1 mg via INTRAVENOUS
  Filled 2015-11-22: qty 1

## 2015-11-22 NOTE — ED Notes (Signed)
Patient asking for PO fluids, PA-C informed of same, patient updated of NPO status until surgery updates on disposition. Verbalizes understanding of same.
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
## 2015-11-22 NOTE — Progress Notes (Signed)
Called pt for pre-op call, went straight to voicemail. Left message with pre-op instructions. Instructed pt to be here at 11:30 AM.
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
## 2015-11-22 NOTE — ED Provider Notes (Signed)
  Physical Exam  BP 170/112 mmHg  Pulse 100  Temp(Src) 99.3 F (37.4 C) (Oral)  Resp 20  Ht 6\' 1"  (1.854 m)  Wt 113.399 kg  BMI 32.99 kg/m2  SpO2 98%  Physical Exam  ED Course  Procedures  MDM Assuming care [facility] Pt is being admitted by ENT for possible surgery today of fractured mandible. Will control pt pain until ENT admits pt at 7am.  7:50: [name] informed me she will call me with information on the pts surgery.   10:00am: Spoke with Dr. Adrienne MochaSanger-Dillingham Who informed me the patient may be discharged home. He is to have a liquid diet only. She has scheduled him for surgery tomorrow. I will inform him to call her office for location, time and instructions for surgery. Will discharge the patient with pain meds and Zofran. VSS, patient appears stable and ready for discharge. He expressed understanding to the discharge instructions.  Jerre SimonJessica L Honestee Revard, PA 11/22/15 1159  Shon Batonourtney F Horton, MD 11/23/15 1534

## 2015-11-22 NOTE — ED Notes (Signed)
Pt returned from CT °

## 2015-11-23 ENCOUNTER — Ambulatory Visit (HOSPITAL_COMMUNITY): Payer: Self-pay | Admitting: Certified Registered Nurse Anesthetist

## 2015-11-23 ENCOUNTER — Ambulatory Visit (HOSPITAL_COMMUNITY)
Admission: RE | Admit: 2015-11-23 | Discharge: 2015-11-23 | Disposition: A | Discharge status: Home / Self Care | Payer: Self-pay | Source: Ambulatory Visit | Attending: Plastic Surgery | Admitting: Plastic Surgery

## 2015-11-23 ENCOUNTER — Encounter (HOSPITAL_COMMUNITY)
Admission: RE | Disposition: A | Discharge status: Home / Self Care | Payer: Self-pay | Source: Ambulatory Visit | Attending: Plastic Surgery

## 2015-11-23 ENCOUNTER — Encounter (HOSPITAL_COMMUNITY): Payer: Self-pay | Admitting: Plastic Surgery

## 2015-11-23 DIAGNOSIS — F17290 Nicotine dependence, other tobacco product, uncomplicated: Secondary | ICD-10-CM | POA: Insufficient documentation

## 2015-11-23 DIAGNOSIS — S02650A Fracture of angle of mandible, unspecified side, initial encounter for closed fracture: Secondary | ICD-10-CM

## 2015-11-23 DIAGNOSIS — Z538 Procedure and treatment not carried out for other reasons: Secondary | ICD-10-CM | POA: Insufficient documentation

## 2015-11-23 DIAGNOSIS — F1210 Cannabis abuse, uncomplicated: Secondary | ICD-10-CM | POA: Insufficient documentation

## 2015-11-23 DIAGNOSIS — S02609A Fracture of mandible, unspecified, initial encounter for closed fracture: Secondary | ICD-10-CM | POA: Insufficient documentation

## 2015-11-23 SURGERY — OPEN REDUCTION INTERNAL FIXATION (ORIF) MANDIBULAR FRACTURE
Anesthesia: General

## 2015-11-23 MED ORDER — MIDAZOLAM HCL 2 MG/2ML IJ SOLN
INTRAMUSCULAR | Status: AC
Start: 2015-11-23 — End: 2015-11-23
  Filled 2015-11-23: qty 2

## 2015-11-23 MED ORDER — ROCURONIUM BROMIDE 50 MG/5ML IV SOLN
INTRAVENOUS | Status: AC
Start: 2015-11-23 — End: 2015-11-23
  Filled 2015-11-23: qty 1

## 2015-11-23 MED ORDER — ARTIFICIAL TEARS OP OINT
TOPICAL_OINTMENT | OPHTHALMIC | Status: AC
  Filled 2015-11-23: qty 3.5

## 2015-11-23 MED ORDER — LACTATED RINGERS IV SOLN
INTRAVENOUS | Status: DC
  Administered 2015-11-23: 13:00:00 via INTRAVENOUS

## 2015-11-23 MED ORDER — FENTANYL CITRATE (PF) 250 MCG/5ML IJ SOLN
INTRAMUSCULAR | Status: AC
  Filled 2015-11-23: qty 5

## 2015-11-23 MED ORDER — PROPOFOL 10 MG/ML IV BOLUS
INTRAVENOUS | Status: AC
  Filled 2015-11-23: qty 20

## 2015-11-23 MED ORDER — ONDANSETRON HCL 4 MG/2ML IJ SOLN
INTRAMUSCULAR | Status: AC
  Filled 2015-11-23: qty 2

## 2015-11-23 NOTE — Interval H&P Note (Signed)
History and Physical Interval Note:  11/23/2015 7:15 AM  Adrian BirksLuis S Victor  has presented today for surgery, with the diagnosis of mandible fracture  The various methods of treatment have been discussed with the patient and family. After consideration of risks, benefits and other options for treatment, the patient has consented to  Procedure(s): OPEN REDUCTION INTERNAL FIXATION (ORIF) MANDIBULAR FRACTURE, possible MMF (N/A) as a surgical intervention .  The patient's history has been reviewed, patient examined, no change in status, stable for surgery.  I have reviewed the patient's chart and labs.  Questions were answered to the patient's satisfaction.     Peggye FormLAIRE S Anner Baity

## 2015-11-23 NOTE — Anesthesia Preprocedure Evaluation (Addendum)
Anesthesia Evaluation  Patient identified by MRN, date of birth, ID band Patient awake    Reviewed: Allergy & Precautions, NPO status , Patient's Chart, lab work & pertinent test results  Airway Mallampati: II  TM Distance: >3 FB Neck ROM: Full  Mouth opening: Limited Mouth Opening  Dental no notable dental hx.    Pulmonary Current Smoker,    Pulmonary exam normal breath sounds clear to auscultation       Cardiovascular negative cardio ROS Normal cardiovascular exam Rhythm:Regular Rate:Normal     Neuro/Psych negative neurological ROS  negative psych ROS   GI/Hepatic negative GI ROS, Neg liver ROS,   Endo/Other    Renal/GU negative Renal ROS  negative genitourinary   Musculoskeletal negative musculoskeletal ROS (+)   Abdominal   Peds negative pediatric ROS (+)  Hematology negative hematology ROS (+)   Anesthesia Other Findings   Reproductive/Obstetrics negative OB ROS                            Anesthesia Physical Anesthesia Plan  ASA: II  Anesthesia Plan: General   Post-op Pain Management:    Induction: Intravenous  Airway Management Planned: Nasal ETT and Oral ETT  Additional Equipment:   Intra-op Plan:   Post-operative Plan: Extubation in OR  Informed Consent: I have reviewed the patients History and Physical, chart, labs and discussed the procedure including the risks, benefits and alternatives for the proposed anesthesia with the patient or authorized representative who has indicated his/her understanding and acceptance.   Dental advisory given  Plan Discussed with: CRNA and Surgeon  Anesthesia Plan Comments: (Patient left AMA prior to surgery)       Anesthesia Quick Evaluation
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
## 2015-11-23 NOTE — Progress Notes (Signed)
Patient discharged. All patient belongings taken with patient.

## 2015-11-23 NOTE — Progress Notes (Signed)
Nurse called Dr. Sharlene Doryillingham X 2. No answer. [name] then called Short Stay and Nurse informed her that patient had returned to Short Stay. [name] stated that since patient had left, that he would need to follow up at her office in one week. Will inform patient of this.

## 2015-11-23 NOTE — Progress Notes (Signed)
RN stepped out of room to call Dr. Okey Hess and inform him that patient had blended up and eaten "beans," and had a strawberry/mango drink. Before calling, patients girlfriend "Adrian Hess" came up to Nurses station and asked Nurse how to get to the volunteer desk because the patient was being mean to her, and she just wanted to get some air. Adrian Hess informed Nurse that she told patient not to eat or drink anything before his surgery, but he did not listen. Nurse took Adrian Hess to the [employer] desk where she registered and received her pager.  Upon entering Short Stay another Nurse stated that the patient had ripped his IV out and was leaving. Nurse entered patients room. Patient was dressing, and blood was coming from his IV site; IV catheter intact on the floor. Nurse asked patient what was wrong, and he stated that his girlfriend was leaving him and was going to wipe out his bank account. Nurse then tried to explain to patient that Adrian Hess did not leave and that she was waiting in the waiting room. Patient stated "no she's not, I know she's gone!" Nurse then asked patient if I could at least place some gauze on his bleeding site. Patient agreed. Patient was leaving short stay. On the way out patient saw Dr. Ulice Hess and she tried to talk to patient. Patient had a brief conversation with Dr. Ulice Hess and he stated he was leaving.  Patient went to waiting area where his girlfriend Adrian Hess was. Patient was noted to be yelling at her, using foul language, and talking to her in a different language. Adrian Hess remained calm and informed patient that she was only waiting in the waiting room for him because of the way he was talking to her in short stay. Patient then stated "I'm going to call my real family...Marland Kitchen.Marland Kitchen.the ones who really care about me." Patient then walked off. Nurse stayed with Adrian Hess and she became tearful and stated "we have three kids...Marland Kitchen.Marland Kitchen.I don't know what I'm going to do...Marland Kitchen.Marland Kitchen.I don't know why he's acting like  this."  Shortly thereafter patient returned back to the waiting area where Adrian Hess was, and he began yelling at her and once again using foul language. This time patient was requesting that she give him her debit card or write him a check. Adrian Hess handed him the debit card, and patient stated "no, I want a check!" Adrian Hess asked patient to get her a pen and she would write him a check. Patient stated "no, you get your own pen!" Afterwards Nurse called into Short Stay and requested that security be called to the Admitting area for back up. Patient left waiting area again.  Patient returned back to waiting area and Security X 3 and 1 police officer was there speaking with Nurse and Adrian Hess. Security then spoke with patient. Patient appeared to be upset and was once again speaking to Adrian Hess in a different language. Adrian Hess was speaking to patient in [place] and stated "I didn't call them!" Nurse then informed patient that I called security for back up.  Patient then stated he wanted to have surgery. Nurse then escorted patient back to short stay.

## 2017-03-05 ENCOUNTER — Ambulatory Visit: Payer: Self-pay | Admitting: Physician Assistant

## 2017-03-13 ENCOUNTER — Ambulatory Visit (INDEPENDENT_AMBULATORY_CARE_PROVIDER_SITE_OTHER): Payer: BLUE CROSS/BLUE SHIELD | Admitting: Physician Assistant

## 2017-03-13 ENCOUNTER — Encounter: Payer: Self-pay | Admitting: Physician Assistant

## 2017-03-13 VITALS — BP 146/95 | HR 77 | Temp 98.7°F | Resp 16 | Ht 73.0 in | Wt 257.0 lb

## 2017-03-13 DIAGNOSIS — Z1329 Encounter for screening for other suspected endocrine disorder: Secondary | ICD-10-CM | POA: Diagnosis not present

## 2017-03-13 DIAGNOSIS — Z1321 Encounter for screening for nutritional disorder: Secondary | ICD-10-CM | POA: Diagnosis not present

## 2017-03-13 DIAGNOSIS — Z130 Encounter for screening for diseases of the blood and blood-forming organs and certain disorders involving the immune mechanism: Secondary | ICD-10-CM | POA: Diagnosis not present

## 2017-03-13 DIAGNOSIS — Z113 Encounter for screening for infections with a predominantly sexual mode of transmission: Secondary | ICD-10-CM | POA: Diagnosis not present

## 2017-03-13 DIAGNOSIS — Z0000 Encounter for general adult medical examination without abnormal findings: Secondary | ICD-10-CM

## 2017-03-13 DIAGNOSIS — I10 Essential (primary) hypertension: Secondary | ICD-10-CM

## 2017-03-13 DIAGNOSIS — Z13228 Encounter for screening for other metabolic disorders: Secondary | ICD-10-CM

## 2017-03-13 MED ORDER — AMLODIPINE BESYLATE 5 MG PO TABS: 5.0000 mg | ORAL_TABLET | Freq: Every day | ORAL | 1 refills | Status: DC

## 2017-03-13 NOTE — Progress Notes (Signed)
03/13/2017 2:28 PM   DOB: 1980/10/24 / MRN: [id number]  SUBJECTIVE:  Adrian Hess is a 36 y.o. male presenting for annual exam.  Does not get a lot of care.  Blood pressures have ranged into hypertension for the last two years. Is willing to start meds.  Smokes about1 pack weekly.  Is willing to let this go too.  Needs to lose weight and will work on this. Likes to swim at the gym 2-3 times a week at 45 minutes per session.    Immunization History  Administered Date(s) Administered  . Tdap 01/19/2013     He is allergic to percocet [oxycodone-acetaminophen].   He  has a past medical history of Hypertension; Insomnia; and Wears contact lenses.    He  reports that he has been smoking Cigars and Cigarettes.  He has never used smokeless tobacco. He reports that he drinks about 3.0 oz of alcohol per week . He reports that he uses drugs, including Marijuana. He  has no sexual activity history on file. The patient  has a past surgical history that includes Knee surgery and Mandible fracture surgery.  His family history includes Anxiety disorder in his mother and sister; Cancer in his maternal grandmother; Depression in his mother; Diabetes in his father, mother, and sister; Hypertension in his mother.  Review of Systems  Constitutional: Negative for chills, diaphoresis and fever.  Respiratory: Negative for cough, hemoptysis, sputum production, shortness of breath and wheezing.   Cardiovascular: Negative for chest pain, orthopnea and leg swelling.  Gastrointestinal: Negative for abdominal pain, blood in stool, constipation, diarrhea, heartburn, melena, nausea and vomiting.  Genitourinary: Negative for flank pain.  Skin: Negative for rash.  Neurological: Negative for dizziness.    The problem list and medications were reviewed and updated by myself where necessary and exist elsewhere in the encounter.   OBJECTIVE:  BP (!) 146/95   Pulse 77   Temp 98.7 F (37.1 C) (Oral)   Resp 16   Ht   (1.854 m)   Wt 257 lb (116.6 kg)   SpO2 99%   BMI 33.91 kg/m   Wt Readings from Last 3 Encounters:  03/13/17 257 lb (116.6 kg)  11/23/15 250 lb (113.4 kg)  11/22/15 250 lb (113.4 kg)   Temp Readings from Last 3 Encounters:  03/13/17 98.7 F (37.1 C) (Oral)  11/23/15 99.2 F (37.3 C) (Oral)  11/22/15 99.3 F (37.4 C) (Oral)   BP Readings from Last 3 Encounters:  03/13/17 (!) 146/95  11/23/15 (!) 143/90  11/22/15 154/99   Pulse Readings from Last 3 Encounters:  03/13/17 77  11/23/15 79  11/22/15 85     Physical Exam  Constitutional: He is active and cooperative.  Cardiovascular: Normal rate.   Pulmonary/Chest: Effort normal. No tachypnea.  Neurological: He is alert.  Skin: Skin is warm.  Vitals reviewed.   No results found for this or any previous visit (from the past 72 hour(s)).  No results found.  ASSESSMENT AND PLAN:  Adrian Hess was seen today for annual exam.  Diagnoses and all orders for this visit:  Annual physical exam  Screening for endocrine, nutritional, metabolic and immunity disorder -     CBC -     Lipid panel -     TSH -     Hemoglobin A1c -     HIV antibody  Screening examination for STD (sexually transmitted disease) -     RPR -     GC/Chlamydia Probe  Amp -     Trichomonas vaginalis, RNA  Hypertension, unspecified type: RTC in 6 months.  -     amLODipine (NORVASC) 5 MG tablet; Take 1 tablet (5 mg total) by mouth daily. Take 1/2 tab for a week then increase to 1 tab.  Other orders -     Cancel: Flu Vaccine QUAD 36+ mos IM    The patient is advised to call or return to clinic if he does not see an improvement in symptoms, or to seek the care of the closest emergency department if he worsens with the above plan.   Deliah BostonMichael Oneda Duffett, MHS, PA-C [facility] 03/13/2017 2:28 PM

## 2017-03-13 NOTE — Patient Instructions (Addendum)
Please stop smoking.  Try to lose 25 lbs in the next 6 months.  Don't miss doses of your BP medication.      IF you received an x-ray today, you will receive an [facility]. Please contact [facility] at [phone] with questions or concerns regarding your invoice.   IF you received labwork today, you will receive an invoice from [facility]. Please contact [facility] at [phone] with questions or concerns regarding your invoice.   Our billing staff will not be able to assist you with questions regarding bills from these companies.  You will be contacted with the lab results as soon as they are available. The fastest way to get your results is to activate your My Chart account. Instructions are located on the last page of this paperwork. If you have not heard from us regarding the results in 2 weeks, please contact this office.

## 2017-03-14 LAB — HEMOGLOBIN A1C
ESTIMATED AVERAGE GLUCOSE: 114 mg/dL
Hgb A1c MFr Bld: 5.6 % (ref 4.8–5.6)

## 2017-03-14 LAB — LIPID PANEL
CHOL/HDL RATIO: 5.8 ratio — AB (ref 0.0–5.0)
Cholesterol, Total: 213 mg/dL — ABNORMAL HIGH (ref 100–199)
HDL: 37 mg/dL — ABNORMAL LOW (ref >39)
Triglycerides: 425 mg/dL — ABNORMAL HIGH (ref 0–149)

## 2017-03-14 LAB — GC/CHLAMYDIA PROBE AMP
Chlamydia trachomatis, NAA: NEGATIVE
NEISSERIA GONORRHOEAE BY PCR: NEGATIVE

## 2017-03-14 LAB — CBC
HEMATOCRIT: 43.8 % (ref 37.5–51.0)
HEMOGLOBIN: 14.8 g/dL (ref 13.0–17.7)
MCH: 30.6 pg (ref 26.6–33.0)
MCHC: 33.8 g/dL (ref 31.5–35.7)
MCV: 91 fL (ref 79–97)
Platelets: 199 10*3/uL (ref 150–379)
RBC: 4.84 x10E6/uL (ref 4.14–5.80)
RDW: 14.1 % (ref 12.3–15.4)
WBC: 9.1 10*3/uL (ref 3.4–10.8)

## 2017-03-14 LAB — RPR: RPR Ser Ql: NONREACTIVE

## 2017-03-14 LAB — TRICHOMONAS VAGINALIS, PROBE AMP: Trich vag by NAA: NEGATIVE

## 2017-03-14 LAB — HIV ANTIBODY (ROUTINE TESTING W REFLEX): HIV Screen 4th Generation wRfx: NONREACTIVE

## 2017-03-14 LAB — TSH: TSH: 3.3 u[IU]/mL (ref 0.450–4.500)

## 2017-03-20 ENCOUNTER — Encounter: Payer: Self-pay | Admitting: Family Medicine

## 2017-03-20 ENCOUNTER — Ambulatory Visit (INDEPENDENT_AMBULATORY_CARE_PROVIDER_SITE_OTHER): Payer: BLUE CROSS/BLUE SHIELD

## 2017-03-20 ENCOUNTER — Ambulatory Visit (INDEPENDENT_AMBULATORY_CARE_PROVIDER_SITE_OTHER): Payer: BLUE CROSS/BLUE SHIELD | Admitting: Family Medicine

## 2017-03-20 VITALS — BP 130/93 | HR 109 | Temp 98.5°F | Resp 18 | Ht 73.0 in | Wt 249.2 lb

## 2017-03-20 DIAGNOSIS — K5909 Other constipation: Secondary | ICD-10-CM | POA: Diagnosis not present

## 2017-03-20 DIAGNOSIS — R1032 Left lower quadrant pain: Secondary | ICD-10-CM | POA: Diagnosis not present

## 2017-03-20 LAB — CBC
HEMATOCRIT: 42.1 % (ref 37.5–51.0)
Hemoglobin: 14.3 g/dL (ref 13.0–17.7)
MCH: 30.4 pg (ref 26.6–33.0)
MCHC: 34 g/dL (ref 31.5–35.7)
MCV: 90 fL (ref 79–97)
PLATELETS: 181 10*3/uL (ref 150–379)
RBC: 4.7 x10E6/uL (ref 4.14–5.80)
RDW: 13.6 % (ref 12.3–15.4)
WBC: 11.9 10*3/uL — ABNORMAL HIGH (ref 3.4–10.8)

## 2017-03-20 LAB — COMPREHENSIVE METABOLIC PANEL
ALK PHOS: 96 IU/L (ref 39–117)
ALT: 37 IU/L (ref 0–44)
AST: 21 IU/L (ref 0–40)
Albumin/Globulin Ratio: 1.8 (ref 1.2–2.2)
Albumin: 4.7 g/dL (ref 3.5–5.5)
BUN/Creatinine Ratio: 8 — ABNORMAL LOW (ref 9–20)
BUN: 8 mg/dL (ref 6–20)
Bilirubin Total: 1.3 mg/dL — ABNORMAL HIGH (ref 0.0–1.2)
CO2: 24 mmol/L (ref 20–29)
CREATININE: 0.98 mg/dL (ref 0.76–1.27)
Calcium: 9.4 mg/dL (ref 8.7–10.2)
Chloride: 100 mmol/L (ref 96–106)
GFR calc Af Amer: 115 mL/min/{1.73_m2} (ref >59)
GFR calc non Af Amer: 99 mL/min/{1.73_m2} (ref >59)
GLOBULIN, TOTAL: 2.6 g/dL (ref 1.5–4.5)
Glucose: 92 mg/dL (ref 65–99)
POTASSIUM: 4.2 mmol/L (ref 3.5–5.2)
SODIUM: 139 mmol/L (ref 134–144)
Total Protein: 7.3 g/dL (ref 6.0–8.5)

## 2017-03-20 MED ORDER — METOCLOPRAMIDE HCL 5 MG PO TABS: 5.0000 mg | ORAL_TABLET | Freq: Four times a day (QID) | ORAL | 0 refills | Status: DC

## 2017-03-20 MED ORDER — PSYLLIUM 58.6 % PO POWD: ORAL | 3 refills | Status: DC

## 2017-03-20 NOTE — Patient Instructions (Addendum)
   IF you received an x-ray today, you will receive an invoice from [facility]. Please contact [facility] at [phone] with questions or concerns regarding your invoice.   IF you received labwork today, you will receive an invoice from [facility]. Please contact [facility] at [phone] with questions or concerns regarding your invoice.   Our billing staff will not be able to assist you with questions regarding bills from these companies.  You will be contacted with the lab results as soon as they are available. The fastest way to get your results is to activate your My Chart account. Instructions are located on the last page of this paperwork. If you have not heard from us regarding the results in 2 weeks, please contact this office.      Abdominal Bloating When you have abdominal bloating, your abdomen may feel full, tight, or painful. It may also look bigger than normal or swollen (distended). Common causes of abdominal bloating include:  Swallowing air.  Constipation.  Problems digesting food.  Eating too much.  Irritable bowel syndrome. This is a condition that affects the large intestine.  Lactose intolerance. This is an inability to digest lactose, a natural sugar in dairy products.  Celiac disease. This is a condition that affects the ability to digest gluten, a protein found in some grains.  Gastroparesis. This is a condition that slows down the movement of food in the stomach and small intestine. It is more common in people with diabetes mellitus.  Gastroesophageal reflux disease (GERD). This is a digestive condition that makes stomach acid flow back into the esophagus.  Urinary retention. This means that the body is holding onto urine, and the bladder cannot be emptied all the way.  Follow these instructions at home: Eating and drinking  Avoid eating too much.  Try not to swallow air while talking or eating.  Avoid eating while lying  down.  Avoid these foods and drinks: ? Foods that cause gas, such as broccoli, cabbage, cauliflower, and baked beans. ? Carbonated drinks. ? Hard candy. ? Chewing gum. Medicines  Take over-the-counter and prescription medicines only as told by your health care provider.  Take probiotic medicines. These medicines contain live bacteria or yeasts that can help digestion.  Take coated peppermint oil capsules. Activity  Try to exercise regularly. Exercise may help to relieve bloating that is caused by gas and relieve constipation. General instructions  Keep all follow-up visits as told by your health care provider. This is important. Contact a health care provider if:  You have nausea and vomiting.  You have diarrhea.  You have abdominal pain.  You have unusual weight loss or weight gain.  You have severe pain, and medicines do not help. Get help right away if:  You have severe chest pain.  You have trouble breathing.  You have shortness of breath.  You have trouble urinating.  You have darker urine than normal.  You have blood in your stools or have dark, tarry stools. Summary  Abdominal bloating means that the abdomen is swollen.  Common causes of abdominal bloating are swallowing air, constipation, and problems digesting food.  Avoid eating too much and avoid swallowing air.  Avoid foods that cause gas, carbonated drinks, hard candy, and chewing gum. This information is not intended to replace advice given to you by your health care provider. Make sure you discuss any questions you have with your health care provider. Document Released: 07/19/2016 Document Revised: 07/19/2016 Document Reviewed: 07/19/2016 Elsevier   Interactive Patient Education  2018 Elsevier Inc.  

## 2017-03-20 NOTE — Progress Notes (Signed)
Chief Complaint  Patient presents with  . Abdominal Pain    x 2 days,  sharp pain, feels a little better today.      HPI  Pt reports that he developed nausea  He states that for 2 days he started having sharp pains and tightness on the LLQ after eating He states that if he eats a meal the pain starts in the LLQ He reports that he has not been able to have a BM in 2 days He states that because of the pain he had a smoothy this morning The last 4 days he has been having fruits but avoided sandwiches He had garlic, onion and honey which helped.   Past Medical History:  Diagnosis Date  . Hypertension   . Insomnia   . Wears contact lenses     Current Outpatient Prescriptions  Medication Sig Dispense Refill  . amLODipine (NORVASC) 5 MG tablet Take 1 tablet (5 mg total) by mouth daily. Take 1/2 tab for a week then increase to 1 tab. 90 tablet 1  . Multiple Vitamin (MULTIVITAMIN WITH MINERALS) TABS tablet Take 1 tablet by mouth daily.    Marland Kitchen ibuprofen (ADVIL,MOTRIN) 200 MG tablet Take 600 mg by mouth every 6 (six) hours as needed (For pain.).    Marland Kitchen metoCLOPramide (REGLAN) 5 MG tablet Take 1 tablet (5 mg total) by mouth 4 (four) times daily. 12 tablet 0  . ondansetron (ZOFRAN-ODT) 8 MG disintegrating tablet Take 1 tablet (8 mg total) by mouth every 8 (eight) hours as needed for nausea or vomiting. (Patient not taking: Reported on 03/20/2017) 20 tablet 0  . oxyCODONE-acetaminophen (PERCOCET/ROXICET) 5-325 MG tablet Take 2 tablets by mouth every 4 (four) hours as needed for severe pain. (Patient not taking: Reported on 03/20/2017) 16 tablet 0  . psyllium (METAMUCIL SMOOTH TEXTURE) 58.6 % powder Take one packet before breakfast daily for regulating your bowels 283 g 3   No current facility-administered medications for this visit.     Allergies:  Allergies  Allergen Reactions  . Percocet [Oxycodone-Acetaminophen] Hypertension    irriatable    Past Surgical History:  Procedure Laterality  Date  . KNEE SURGERY     right meniscus  . MANDIBLE FRACTURE SURGERY      Social History   Social History  . Marital status: Single    Spouse name: N/A  . Number of children: N/A  . Years of education: N/A   Social History Main Topics  . Smoking status: Current Some Day Smoker    Types: Cigars, Cigarettes  . Smokeless tobacco: Never Used  . Alcohol use 3.0 oz/week    5 Cans of beer per week  . Drug use: Yes    Types: Marijuana  . Sexual activity: Not Asked   Other Topics Concern  . 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 He appears well-developed and well-nourished.  HENT:  Head: Normocephalic and atraumatic.  Cardiovascular: Normal rate, regular rhythm and normal heart sounds.   Pulmonary/Chest: Breath sounds  normal. No respiratory distress. He has no wheezes.  Abdominal: Normal appearance and bowel sounds are normal. There is no hepatosplenomegaly. There is tenderness in the left lower quadrant. There is no rigidity, no rebound, no guarding, no CVA tenderness, no tenderness at McBurney's point and negative Murphy's sign. No hernia.  Neurological: He is alert and oriented to person, place, and time.   abd xray Moderate stool  No free air No obstruction  Assessment and Plan Kemal was seen today for abdominal pain.  Diagnoses and all orders for this visit:  LLQ abdominal pain -     DG Abd 1 View -     CBC -     Comprehensive metabolic panel -     psyllium (METAMUCIL SMOOTH TEXTURE) 58.6 % powder; Take one packet before breakfast daily for regulating your  bowels No obstruction Xray consistent with exam finding suggesting constipation  Other constipation reglan and fiber Increase walking and hydration  Other orders -     Cancel: Flu Vaccine QUAD 36+ mos IM -     metoCLOPramide (REGLAN) 5 MG tablet; Take 1 tablet (5 mg total) by mouth 4 (four) times daily.     Sonny Poth A Sharmeka Palmisano
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
## 2017-11-07 ENCOUNTER — Encounter: Payer: Self-pay | Admitting: Emergency Medicine

## 2017-11-07 ENCOUNTER — Ambulatory Visit (INDEPENDENT_AMBULATORY_CARE_PROVIDER_SITE_OTHER): Payer: BLUE CROSS/BLUE SHIELD | Admitting: Emergency Medicine

## 2017-11-07 ENCOUNTER — Other Ambulatory Visit: Payer: Self-pay

## 2017-11-07 ENCOUNTER — Ambulatory Visit (INDEPENDENT_AMBULATORY_CARE_PROVIDER_SITE_OTHER): Payer: BLUE CROSS/BLUE SHIELD

## 2017-11-07 VITALS — BP 138/106 | HR 79 | Temp 98.6°F | Resp 16 | Ht 73.0 in | Wt 257.2 lb

## 2017-11-07 DIAGNOSIS — M545 Low back pain, unspecified: Secondary | ICD-10-CM | POA: Insufficient documentation

## 2017-11-07 DIAGNOSIS — M7918 Myalgia, other site: Secondary | ICD-10-CM

## 2017-11-07 DIAGNOSIS — M62838 Other muscle spasm: Secondary | ICD-10-CM

## 2017-11-07 MED ORDER — CYCLOBENZAPRINE HCL 10 MG PO TABS: 10.0000 mg | ORAL_TABLET | Freq: Three times a day (TID) | ORAL | 0 refills | Status: DC | PRN

## 2017-11-07 MED ORDER — KETOROLAC TROMETHAMINE 60 MG/2ML IM SOLN
60.0000 mg | Freq: Once | INTRAMUSCULAR | Status: AC
  Administered 2017-11-07: 60 mg via INTRAMUSCULAR

## 2017-11-07 MED ORDER — DICLOFENAC SODIUM 75 MG PO TBEC: 75.0000 mg | DELAYED_RELEASE_TABLET | Freq: Two times a day (BID) | ORAL | 0 refills | Status: AC

## 2017-11-07 NOTE — Patient Instructions (Addendum)
     IF you received an x-ray today, you will receive an invoice from [facility]. Please contact [facility] at [phone] with questions or concerns regarding your invoice.   IF you received labwork today, you will receive an invoice from [facility]. Please contact [facility] at [phone] with questions or concerns regarding your invoice.   Our billing staff will not be able to assist you with questions regarding bills from these companies.  You will be contacted with the lab results as soon as they are available. The fastest way to get your results is to activate your My Chart account. Instructions are located on the last page of this paperwork. If you have not heard from us regarding the results in 2 weeks, please contact this office.      Back Pain, Adult Back pain is very common. The pain often gets better over time. The cause of back pain is usually not dangerous. Most people can learn to manage their back pain on their own. Follow these instructions at home: Watch your back pain for any changes. The following actions may help to lessen any pain you are feeling:  Stay active. Start with short walks on flat ground if you can. Try to walk farther each day.  Exercise regularly as told by your doctor. Exercise helps your back heal faster. It also helps avoid future injury by keeping your muscles strong and flexible.  Do not sit, drive, or stand in one place for more than 30 minutes.  Do not stay in bed. Resting more than 1-2 days can slow down your recovery.  Be careful when you bend or lift an object. Use good form when lifting: ? Bend at your knees. ? Keep the object close to your body. ? Do not twist.  Sleep on a firm mattress. Lie on your side, and bend your knees. If you lie on your back, put a pillow under your knees.  Take medicines only as told by your doctor.  Put ice on the injured area. ? Put ice in a plastic bag. ? Place a towel between your  skin and the bag. ? Leave the ice on for 20 minutes, 2-3 times a day for the first 2-3 days. After that, you can switch between ice and heat packs.  Avoid feeling anxious or stressed. Find good ways to deal with stress, such as exercise.  Maintain a healthy weight. Extra weight puts stress on your back.  Contact a doctor if:  You have pain that does not go away with rest or medicine.  You have worsening pain that goes down into your legs or buttocks.  You have pain that does not get better in one week.  You have pain at night.  You lose weight.  You have a fever or chills. Get help right away if:  You cannot control when you poop (bowel movement) or pee (urinate).  Your arms or legs feel weak.  Your arms or legs lose feeling (numbness).  You feel sick to your stomach (nauseous) or throw up (vomit).  You have belly (abdominal) pain.  You feel like you may pass out (faint). This information is not intended to replace advice given to you by your health care provider. Make sure you discuss any questions you have with your health care provider. Document Released: 12/04/2007 Document Revised: 11/23/2015 Document Reviewed: 10/19/2013 Elsevier Interactive Patient Education  2018 Elsevier Inc.  

## 2017-11-07 NOTE — Progress Notes (Signed)
Adrian Hess 37 y.o.   Chief Complaint  Patient presents with  . Back Pain    x 3 days, hurts all over    HISTORY OF PRESENT ILLNESS: This is a 37 y.o. male complaining of low back pain that started about 3 days ago.  No significant injury.  No neurological symptoms.  No bowel or bladder issues.  Back Pain  This is a new problem. The current episode started in the past 7 days. The problem occurs constantly. The problem is unchanged. The pain is present in the lumbar spine. The pain does not radiate. The pain is at a severity of 5/10. The pain is moderate. The pain is the same all the time. The symptoms are aggravated by bending, lying down, position and standing. Pertinent negatives include no abdominal pain, bladder incontinence, bowel incontinence, chest pain, dysuria, fever, headaches, leg pain, numbness, paresis, paresthesias, pelvic pain, perianal numbness, tingling, weakness or weight loss. Risk factors: none. He has tried nothing for the symptoms.     Prior to Admission medications   Medication Sig Start Date End Date Taking? Authorizing Provider  amLODipine (NORVASC) 5 MG tablet Take 1 tablet (5 mg total) by mouth daily. Take 1/2 tab for a week then increase to 1 tab. Patient not taking: Reported on 11/07/2017 03/13/17   Ofilia Neas, PA-C  metoCLOPramide (REGLAN) 5 MG tablet Take 1 tablet (5 mg total) by mouth 4 (four) times daily. 03/20/17 03/23/17  Doristine Bosworth, MD  oxyCODONE-acetaminophen (PERCOCET/ROXICET) 5-325 MG tablet Take 2 tablets by mouth every 4 (four) hours as needed for severe pain. Patient not taking: Reported on 03/20/2017 11/22/15   Jerre Simon, PA    Allergies  Allergen Reactions  . Percocet [Oxycodone-Acetaminophen] Hypertension    irriatable    There are no active problems to display for this patient.   Past Medical History:  Diagnosis Date  . Hypertension   . Insomnia   . Wears contact lenses     Past Surgical History:  Procedure  Laterality Date  . KNEE SURGERY     right meniscus  . MANDIBLE FRACTURE SURGERY      Social History   Socioeconomic History  . Marital status: Single    Spouse name: Not on file  . Number of children: Not on file  . Years of education: Not on file  . Highest education level: Not on file  Occupational History  . Not on file  Social Needs  . Financial resource strain: Not on file  . Food insecurity:    Worry: Not on file    Inability: Not on file  . Transportation needs:    Medical: Not on file    Non-medical: Not on file  Tobacco Use  . Smoking status: Current Some Day Smoker    Types: Cigars, Cigarettes  . Smokeless tobacco: Never Used  Substance and Sexual Activity  . Alcohol use: Yes    Alcohol/week: 3.0 oz    Types: 5 Cans of beer per week  . Drug use: Yes    Types: Marijuana  . Sexual activity: Not on file  Lifestyle  . Physical activity:    Days per week: Not on file    Minutes per session: Not on file  . Stress: Not on file  Relationships  . Social connections:    Talks on phone: Not on file    Gets together: Not on file    Attends religious service: Not on file  Active member of club or organization: Not on file    Attends meetings of clubs or organizations: Not on file    Relationship status: Not on file  . Intimate partner violence:    Fear of current or ex partner: Not on file    Emotionally abused: Not on file    Physically abused: Not on file    Forced sexual activity: Not on file  Other Topics Concern  . 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 Hypertension Mother   . Diabetes Mother   . Anxiety disorder Mother   . Depression Mother   . Diabetes Father   . Diabetes Sister   . Anxiety disorder Sister   . Cancer Maternal Grandmother   . Heart disease Neg Hx       Review of Systems  Constitutional: Negative for fever and weight loss.  HENT: Negative.  Negative for congestion, nosebleeds and sore throat.   Eyes: Negative.   Respiratory: Negative.  Negative for cough and shortness of breath.   Cardiovascular: Negative for chest pain and palpitations.  Gastrointestinal: Negative.  Negative for abdominal pain, blood in stool, bowel incontinence, diarrhea, nausea and vomiting.  Genitourinary: Negative for bladder incontinence, dysuria and pelvic pain.  Musculoskeletal: Positive for back pain.  Skin: Negative.  Negative for rash.  Neurological: Negative for dizziness, tingling, sensory change, focal weakness, weakness, numbness, headaches and paresthesias.  Endo/Heme/Allergies: Negative.   All other systems reviewed and are negative.   Vitals:   11/07/17 1008  BP: (!) 138/106  Pulse: 79  Resp: 16  Temp: 98.6 F (37 C)  SpO2: 98%    Physical Exam  Constitutional: He is oriented to person, place, and time. He appears well-developed and well-nourished.  HENT:  Head: Normocephalic and atraumatic.  Nose: Nose normal.  Mouth/Throat: Oropharynx is clear and moist.  Eyes: Pupils are equal, round, and reactive to light. Conjunctivae and EOM are normal.  Neck: Normal range of motion. Neck supple.  Cardiovascular: Normal rate, regular rhythm and normal heart sounds.  Pulmonary/Chest: Effort normal and breath sounds normal.  Abdominal: Soft. Bowel sounds are normal. He exhibits no distension. There is no tenderness.  Musculoskeletal:       Lumbar back: He exhibits decreased range of motion, tenderness and spasm. He exhibits no bony tenderness and normal pulse.  Neurological: He is alert and oriented to person, place, and time. He displays normal reflexes. No sensory deficit. He exhibits normal muscle tone. Coordination normal.  Skin: Skin is warm and dry. Capillary refill takes less than 2 seconds. No rash noted.  Psychiatric: He has a normal  mood and affect. His behavior is normal.  Vitals reviewed.  Dg Lumbar Spine 2-3 Views  Result Date: 11/07/2017 CLINICAL DATA:  37 year old male with a history of lumbar pain EXAM: LUMBAR SPINE - 2-3 VIEW COMPARISON:  None. FINDINGS: Lumbar Spine: Lumbar vertebral elements maintain normal alignment without evidence of anterolisthesis, retrolisthesis, subluxation. No fracture line identified. Vertebral body heights maintained as well as disc space heights. No significant degenerative disc disease or endplate changes. No significant facet changes. Unremarkable appearance of the visualized abdomen. IMPRESSION: No acute fracture malalignment of the lumbar spine. Electronically Signed   By: Gilmer Mor D.O.   On: 11/07/2017 11:15     ASSESSMENT & PLAN: [place]  was seen today for back pain.  Diagnoses and all orders for this visit:  Lumbar pain -     DG Lumbar Spine 2-3 Views; Future -     ketorolac (TORADOL) injection 60 mg -     diclofenac (VOLTAREN) 75 MG EC tablet; Take 1 tablet (75 mg total) by mouth 2 (two) times daily for 5 days.  Muscle spasm -     cyclobenzaprine (FLEXERIL) 10 MG tablet; Take 1 tablet (10 mg total) by mouth 3 (three) times daily as needed for muscle spasms.  Musculoskeletal pain -     diclofenac (VOLTAREN) 75 MG EC tablet; Take 1 tablet (75 mg total) by mouth 2 (two) times daily for 5 days.    Patient Instructions       IF you received an x-ray today, you will receive an invoice from [facility]. Please contact [facility] at [phone] with questions or concerns regarding your invoice.   IF you received labwork today, you will receive an invoice from [facility]. Please contact [facility] at [phone] with questions or concerns regarding your invoice.   Our billing staff will not be able to assist you with questions regarding bills from these companies.  You will be contacted with the lab results as soon as they are available. The fastest  way to get your results is to activate your My Chart account. Instructions are located on the last page of this paperwork. If you have not heard from [place] regarding the results in 2 weeks, please contact this office.     Back Pain, Adult Back pain is very common. The pain often gets better over time. The cause of back pain is usually not dangerous. Most people can learn to manage their back pain on their own. Follow these instructions at home: Watch your back pain for any changes. The following actions may help to lessen any pain you are feeling:  Stay active. Start with short walks on flat ground if you can. Try to walk farther each day.  Exercise regularly as told by your doctor. Exercise helps your back heal faster. It also helps avoid future injury by keeping your muscles strong and flexible.  Do not sit, drive, or stand in one place for more than 30 minutes.  Do not stay in bed. Resting more than 1-2 days can slow down your recovery.  Be careful when you bend or lift an object. Use good form when lifting: ? Bend at your knees. ? Keep the object close to your body. ? Do not twist.  Sleep on a firm mattress. Lie on your side, and bend your knees. If you lie on your back, put a pillow under your knees.  Take medicines only as told by your doctor.  Put ice on the injured area. ? Put ice in a plastic bag. ? Place a towel between your skin and the bag. ? Leave the ice on for 20 minutes, 2-3 times a day for the first 2-3 days. After that, you can switch between ice and heat packs.  Avoid feeling anxious or stressed. Find good ways to deal with stress, such as exercise.  Maintain a healthy weight. Extra weight puts stress on your back.  Contact a doctor if:  You have pain that does not go away with rest or medicine.  You have worsening pain that goes down into your legs or buttocks.  You have pain that does not get better in one week.  You have pain at night.  You lose  weight.  You have a fever or chills. Get help right away if:  You cannot control when you poop (bowel movement) or pee (urinate).  Your arms or legs feel weak.  Your arms or legs lose feeling (numbness).  You feel sick to your stomach (nauseous) or throw up (vomit).  You have belly (abdominal) pain.  You feel like you may pass out (faint). This information is not intended to replace advice given to you by your health care provider. Make sure you discuss any questions you have with your health care provider. Document Released: 12/04/2007 Document Revised: 11/23/2015 Document Reviewed: 10/19/2013 Elsevier Interactive Patient Education  2018 Elsevier Inc.      Edwina Barth, MD [facility]

## 2019-03-31 ENCOUNTER — Inpatient Hospital Stay (HOSPITAL_COMMUNITY)
Admission: EM | Admit: 2019-03-31 | Discharge: 2019-04-03 | DRG: 392 | Disposition: A | Discharge status: Home / Self Care | Payer: Medicaid Other | Attending: Student | Admitting: Student

## 2019-03-31 ENCOUNTER — Other Ambulatory Visit: Payer: Self-pay

## 2019-03-31 ENCOUNTER — Encounter (HOSPITAL_COMMUNITY): Payer: Self-pay

## 2019-03-31 DIAGNOSIS — Z833 Family history of diabetes mellitus: Secondary | ICD-10-CM

## 2019-03-31 DIAGNOSIS — Z885 Allergy status to narcotic agent status: Secondary | ICD-10-CM

## 2019-03-31 DIAGNOSIS — Z818 Family history of other mental and behavioral disorders: Secondary | ICD-10-CM

## 2019-03-31 DIAGNOSIS — K5732 Diverticulitis of large intestine without perforation or abscess without bleeding: Secondary | ICD-10-CM | POA: Diagnosis not present

## 2019-03-31 DIAGNOSIS — Z720 Tobacco use: Secondary | ICD-10-CM | POA: Diagnosis present

## 2019-03-31 DIAGNOSIS — F17210 Nicotine dependence, cigarettes, uncomplicated: Secondary | ICD-10-CM | POA: Diagnosis present

## 2019-03-31 DIAGNOSIS — Z03818 Encounter for observation for suspected exposure to other biological agents ruled out: Secondary | ICD-10-CM | POA: Diagnosis not present

## 2019-03-31 DIAGNOSIS — K5792 Diverticulitis of intestine, part unspecified, without perforation or abscess without bleeding: Secondary | ICD-10-CM | POA: Diagnosis present

## 2019-03-31 DIAGNOSIS — Z7982 Long term (current) use of aspirin: Secondary | ICD-10-CM

## 2019-03-31 DIAGNOSIS — Z809 Family history of malignant neoplasm, unspecified: Secondary | ICD-10-CM

## 2019-03-31 DIAGNOSIS — K5900 Constipation, unspecified: Secondary | ICD-10-CM | POA: Diagnosis present

## 2019-03-31 DIAGNOSIS — Z20828 Contact with and (suspected) exposure to other viral communicable diseases: Secondary | ICD-10-CM | POA: Diagnosis present

## 2019-03-31 DIAGNOSIS — Z8249 Family history of ischemic heart disease and other diseases of the circulatory system: Secondary | ICD-10-CM

## 2019-03-31 DIAGNOSIS — I10 Essential (primary) hypertension: Secondary | ICD-10-CM | POA: Diagnosis present

## 2019-03-31 LAB — CBC
HCT: 44.8 % (ref 39.0–52.0)
Hemoglobin: 14.3 g/dL (ref 13.0–17.0)
MCH: 30.4 pg (ref 26.0–34.0)
MCHC: 31.9 g/dL (ref 30.0–36.0)
MCV: 95.3 fL (ref 80.0–100.0)
Platelets: 257 10*3/uL (ref 150–400)
RBC: 4.7 MIL/uL (ref 4.22–5.81)
RDW: 12.1 % (ref 11.5–15.5)
WBC: 15.2 10*3/uL — ABNORMAL HIGH (ref 4.0–10.5)
nRBC: 0 % (ref 0.0–0.2)

## 2019-03-31 LAB — COMPREHENSIVE METABOLIC PANEL
ALT: 41 U/L (ref 0–44)
AST: 25 U/L (ref 15–41)
Albumin: 4.4 g/dL (ref 3.5–5.0)
Alkaline Phosphatase: 83 U/L (ref 38–126)
Anion gap: 11 (ref 5–15)
BUN: 15 mg/dL (ref 6–20)
CO2: 25 mmol/L (ref 22–32)
Calcium: 9.5 mg/dL (ref 8.9–10.3)
Chloride: 103 mmol/L (ref 98–111)
Creatinine, Ser: 0.97 mg/dL (ref 0.61–1.24)
GFR calc Af Amer: >60 mL/min (ref >60)
GFR calc non Af Amer: >60 mL/min (ref >60)
Glucose, Bld: 94 mg/dL (ref 70–99)
Potassium: 4 mmol/L (ref 3.5–5.1)
Sodium: 139 mmol/L (ref 135–145)
Total Bilirubin: 0.9 mg/dL (ref 0.3–1.2)
Total Protein: 8.2 g/dL — ABNORMAL HIGH (ref 6.5–8.1)

## 2019-03-31 LAB — URINALYSIS, ROUTINE W REFLEX MICROSCOPIC
Bilirubin Urine: NEGATIVE
Glucose, UA: NEGATIVE mg/dL
Hgb urine dipstick: NEGATIVE
Ketones, ur: NEGATIVE mg/dL
Leukocytes,Ua: NEGATIVE
Nitrite: NEGATIVE
Protein, ur: 30 mg/dL — AB
Specific Gravity, Urine: 1.025 (ref 1.005–1.030)
pH: 5 (ref 5.0–8.0)

## 2019-03-31 LAB — LIPASE, BLOOD: Lipase: 21 U/L (ref 11–51)

## 2019-03-31 MED ORDER — FENTANYL CITRATE (PF) 100 MCG/2ML IJ SOLN
50.0000 ug | Freq: Once | INTRAMUSCULAR | Status: AC
  Administered 2019-04-01: 50 ug via INTRAVENOUS
  Filled 2019-03-31: qty 2

## 2019-03-31 MED ORDER — SODIUM CHLORIDE 0.9% FLUSH
3.0000 mL | Freq: Once | INTRAVENOUS | Status: AC
  Administered 2019-04-01: 3 mL via INTRAVENOUS

## 2019-03-31 NOTE — ED Provider Notes (Signed)
[facility]-EMERGENCY DEPT Provider Note   CSN: [id number] Arrival date & time: 03/31/19  1918     History   Chief Complaint Chief Complaint  Patient presents with  . Abdominal Pain    HPI Adrian Hess is a 38 y.o. male.     Patient without significant medical history presents with LLQ abdominal pain for the past one week. It started as a concern for constipation but he took laxatives and has been having daily, non-bloody bowel movements for the past 2-3 days. He reports the pain has increased during those 2-3 days and is now more constant. He denies groin pain, scrotal swelling, penile discharge or dysuria. No change in the character of the abdominal pain with urination or bowel movement. No fever at any time. No nausea or vomiting. No alleviating factors.   The history is provided by the patient. No language interpreter was used.    Past Medical History:  Diagnosis Date  . Hypertension   . Insomnia   . Wears contact lenses     Patient Active Problem List   Diagnosis Date Noted  . Lumbar pain 11/07/2017    Past Surgical History:  Procedure Laterality Date  . KNEE SURGERY     right meniscus  . MANDIBLE FRACTURE SURGERY          Home Medications    Prior to Admission medications   Medication Sig Start Date End Date Taking? Authorizing Provider  aspirin EC 325 MG tablet Take 325 mg by mouth daily.   Yes [provider]  amLODipine (NORVASC) 5 MG tablet Take 1 tablet (5 mg total) by mouth daily. Take 1/2 tab for a week then increase to 1 tab. Patient not taking: Reported on 11/07/2017 03/13/17   Ofilia Neas, PA-C  cyclobenzaprine (FLEXERIL) 10 MG tablet Take 1 tablet (10 mg total) by mouth 3 (three) times daily as needed for muscle spasms. Patient not taking: Reported on 03/31/2019 11/07/17   Georgina Quint, MD  metoCLOPramide (REGLAN) 5 MG tablet Take 1 tablet (5 mg total) by mouth 4 (four) times daily. 03/20/17 03/23/17   Doristine Bosworth, MD  oxyCODONE-acetaminophen (PERCOCET/ROXICET) 5-325 MG tablet Take 2 tablets by mouth every 4 (four) hours as needed for severe pain. Patient not taking: Reported on 03/20/2017 11/22/15   Jerre Simon, PA    Family History Family History  Problem Relation Age of Onset  . Hypertension Mother   . Diabetes Mother   . Anxiety disorder Mother   . Depression Mother   . Diabetes Father   . Diabetes Sister   . Anxiety disorder Sister   . Cancer Maternal Grandmother   . Heart disease Neg Hx     Social History Social History   Tobacco Use  . Smoking status: Current Some Day Smoker    Types: Cigars, Cigarettes  . Smokeless tobacco: Never Used  Substance Use Topics  . Alcohol use: Yes    Alcohol/week: 5.0 standard drinks    Types: 5 Cans of beer per week  . Drug use: Yes    Types: Marijuana     Allergies   Percocet [oxycodone-acetaminophen]   Review of Systems Review of Systems  Constitutional: Negative for chills and fever.  HENT: Negative.   Respiratory: Negative.  Negative for cough and shortness of breath.   Cardiovascular: Negative.  Negative for chest pain.  Gastrointestinal: Positive for abdominal pain and constipation. Negative for nausea and vomiting.  Genitourinary: Negative for discharge, dysuria,  genital sores, scrotal swelling and testicular pain.  Musculoskeletal: Negative.   Skin: Negative.   Neurological: Negative.      Physical Exam Updated Vital Signs BP 133/85 (BP Location: Left Arm)   Pulse 89   Temp 99.9 F (37.7 C) (Oral)   Resp 18   Ht 6\' 1"  (1.854 m)   Wt 113.4 kg   SpO2 99%   BMI 32.98 kg/m   Physical Exam Constitutional:      General: He is not in acute distress.    Appearance: He is well-developed. He is obese.  HENT:     Head: Normocephalic.  Neck:     Musculoskeletal: Normal range of motion and neck supple.  Cardiovascular:     Rate and Rhythm: Normal rate and regular rhythm.  Pulmonary:     Effort:  Pulmonary effort is normal.     Breath sounds: Normal breath sounds.  Abdominal:     General: Bowel sounds are decreased. There is no distension.     Palpations: Abdomen is soft.     Tenderness: There is abdominal tenderness in the left lower quadrant. There is guarding. There is no rebound.     Hernia: No hernia is present.  Genitourinary:    Penis: Normal.      Scrotum/Testes: Normal.        Right: Tenderness or swelling not present.        Left: Tenderness or swelling not present.  Musculoskeletal: Normal range of motion.  Skin:    General: Skin is warm and dry.     Findings: No rash.  Neurological:     Mental Status: He is alert.      ED Treatments / Results  Labs (all labs ordered are listed, but only abnormal results are displayed) Labs Reviewed  COMPREHENSIVE METABOLIC PANEL - Abnormal; Notable for the following components:      Result Value   Total Protein 8.2 (*)    All other components within normal limits  CBC - Abnormal; Notable for the following components:   WBC 15.2 (*)    All other components within normal limits  URINALYSIS, ROUTINE W REFLEX MICROSCOPIC - Abnormal; Notable for the following components:   Protein, ur 30 (*)    Bacteria, UA RARE (*)    All other components within normal limits  LIPASE, BLOOD    EKG None  Radiology No results found.  Procedures Procedures (including critical care time)  Medications Ordered in ED Medications  sodium chloride flush (NS) 0.9 % injection 3 mL (has no administration in time range)  fentaNYL (SUBLIMAZE) injection 50 mcg (has no administration in time range)     Initial Impression / Assessment and Plan / ED Course  I have reviewed the triage vital signs and the nursing notes.  Pertinent labs & imaging results that were available during my care of the patient were reviewed by me and considered in my medical decision making (see chart for details).        Patient to ED with LLQ abdominal pain,  progressive over the last one week. No fever, vomiting, bloody stools. Initially constipated, better with laxatives but pain no better. No groin pain.  He is well appearing. VSS, No suggestion sepsis. He is focally tenderness to the LLQ with guarding. He has a mild leukocytosis. Will obtain CT eval for diverticulitis.   CT shows acute sigmoid diverticulitis with findings concerning for early abscess vs contained perforation. These findings were discussed with general surgery, Dr. Gershon Crane, who  advises admit to medicine, surgical team will follow in hospital.   IV Zosyn started, Pain addressed. He continues to be stable and in NAD. Patient updated as to plan and agrees to admission. All questions answered.   CT also shows 'multi focal  Patchy areas of subpleural ground-glass opacity, LLL'. COVID, required for admission, pending. These results were also discussed with the patient who is asymptomatic for COVID - no cough, known fever at home, congestion, SOB, or CP.    Final Clinical Impressions(s) / ED Diagnoses   Final diagnoses:  None   1. Complicated sigmoid diverticulitis  ED Discharge Orders    None       Elpidio AnisUpstill, Shaurya Rawdon, PA-C 04/01/19 0147    Nira Connardama, Pedro Eduardo, MD 04/07/19 [phone]

## 2019-03-31 NOTE — ED Triage Notes (Signed)
Pt arrived stating last week he thought he was constipated and having abdominal pain but was able to have a bowel movement today, now having intermittent left sided lower abdominal pain and pain with urination.

## 2019-04-01 ENCOUNTER — Emergency Department (HOSPITAL_COMMUNITY): Payer: Medicaid Other

## 2019-04-01 ENCOUNTER — Encounter (HOSPITAL_COMMUNITY): Payer: Self-pay

## 2019-04-01 ENCOUNTER — Other Ambulatory Visit: Payer: Self-pay

## 2019-04-01 DIAGNOSIS — K5792 Diverticulitis of intestine, part unspecified, without perforation or abscess without bleeding: Secondary | ICD-10-CM

## 2019-04-01 DIAGNOSIS — I10 Essential (primary) hypertension: Secondary | ICD-10-CM | POA: Diagnosis present

## 2019-04-01 DIAGNOSIS — K5732 Diverticulitis of large intestine without perforation or abscess without bleeding: Secondary | ICD-10-CM | POA: Diagnosis not present

## 2019-04-01 DIAGNOSIS — Z720 Tobacco use: Secondary | ICD-10-CM | POA: Diagnosis present

## 2019-04-01 DIAGNOSIS — Z885 Allergy status to narcotic agent status: Secondary | ICD-10-CM | POA: Diagnosis not present

## 2019-04-01 DIAGNOSIS — Z7982 Long term (current) use of aspirin: Secondary | ICD-10-CM | POA: Diagnosis not present

## 2019-04-01 DIAGNOSIS — K5900 Constipation, unspecified: Secondary | ICD-10-CM | POA: Diagnosis present

## 2019-04-01 DIAGNOSIS — Z833 Family history of diabetes mellitus: Secondary | ICD-10-CM | POA: Diagnosis not present

## 2019-04-01 DIAGNOSIS — Z818 Family history of other mental and behavioral disorders: Secondary | ICD-10-CM | POA: Diagnosis not present

## 2019-04-01 DIAGNOSIS — R109 Unspecified abdominal pain: Secondary | ICD-10-CM | POA: Diagnosis not present

## 2019-04-01 DIAGNOSIS — Z809 Family history of malignant neoplasm, unspecified: Secondary | ICD-10-CM | POA: Diagnosis not present

## 2019-04-01 DIAGNOSIS — Z20828 Contact with and (suspected) exposure to other viral communicable diseases: Secondary | ICD-10-CM | POA: Diagnosis present

## 2019-04-01 DIAGNOSIS — R1032 Left lower quadrant pain: Secondary | ICD-10-CM | POA: Diagnosis present

## 2019-04-01 DIAGNOSIS — Z03818 Encounter for observation for suspected exposure to other biological agents ruled out: Secondary | ICD-10-CM | POA: Diagnosis not present

## 2019-04-01 DIAGNOSIS — F17210 Nicotine dependence, cigarettes, uncomplicated: Secondary | ICD-10-CM | POA: Diagnosis present

## 2019-04-01 DIAGNOSIS — Z8249 Family history of ischemic heart disease and other diseases of the circulatory system: Secondary | ICD-10-CM | POA: Diagnosis not present

## 2019-04-01 LAB — GLUCOSE, CAPILLARY: Glucose-Capillary: 86 mg/dL (ref 70–99)

## 2019-04-01 LAB — CBC
HCT: 40.6 % (ref 39.0–52.0)
Hemoglobin: 13 g/dL (ref 13.0–17.0)
MCH: 30.4 pg (ref 26.0–34.0)
MCHC: 32 g/dL (ref 30.0–36.0)
MCV: 94.9 fL (ref 80.0–100.0)
Platelets: 213 10*3/uL (ref 150–400)
RBC: 4.28 MIL/uL (ref 4.22–5.81)
RDW: 12.2 % (ref 11.5–15.5)
WBC: 10.6 10*3/uL — ABNORMAL HIGH (ref 4.0–10.5)
nRBC: 0 % (ref 0.0–0.2)

## 2019-04-01 LAB — APTT: aPTT: 34 seconds (ref 24–36)

## 2019-04-01 LAB — TYPE AND SCREEN
ABO/RH(D): O POS
Antibody Screen: NEGATIVE

## 2019-04-01 LAB — PROCALCITONIN: Procalcitonin: 0.29 ng/mL

## 2019-04-01 LAB — BASIC METABOLIC PANEL
Anion gap: 7 (ref 5–15)
BUN: 12 mg/dL (ref 6–20)
CO2: 25 mmol/L (ref 22–32)
Calcium: 8.4 mg/dL — ABNORMAL LOW (ref 8.9–10.3)
Chloride: 104 mmol/L (ref 98–111)
Creatinine, Ser: 1.05 mg/dL (ref 0.61–1.24)
GFR calc Af Amer: >60 mL/min (ref >60)
GFR calc non Af Amer: >60 mL/min (ref >60)
Glucose, Bld: 117 mg/dL — ABNORMAL HIGH (ref 70–99)
Potassium: 3.8 mmol/L (ref 3.5–5.1)
Sodium: 136 mmol/L (ref 135–145)

## 2019-04-01 LAB — PROTIME-INR
INR: 1.1 (ref 0.8–1.2)
Prothrombin Time: 13.7 seconds (ref 11.4–15.2)

## 2019-04-01 LAB — LACTIC ACID, PLASMA
Lactic Acid, Venous: 0.9 mmol/L (ref 0.5–1.9)
Lactic Acid, Venous: 0.7 mmol/L (ref 0.5–1.9)

## 2019-04-01 LAB — ABO/RH: ABO/RH(D): O POS

## 2019-04-01 LAB — SARS CORONAVIRUS 2 BY RT PCR (HOSPITAL ORDER, PERFORMED IN ~~LOC~~ HOSPITAL LAB): SARS Coronavirus 2: NEGATIVE

## 2019-04-01 LAB — HIV ANTIBODY (ROUTINE TESTING W REFLEX): HIV Screen 4th Generation wRfx: NONREACTIVE

## 2019-04-01 MED ORDER — AMLODIPINE BESYLATE 10 MG PO TABS
10.0000 mg | ORAL_TABLET | Freq: Every day | ORAL | Status: DC
  Administered 2019-04-01 – 2019-04-03 (×3): 10 mg via ORAL
  Filled 2019-04-01 (×3): qty 1

## 2019-04-01 MED ORDER — ACETAMINOPHEN 650 MG RE SUPP: 650.0000 mg | Freq: Four times a day (QID) | RECTAL | Status: DC | PRN

## 2019-04-01 MED ORDER — NICOTINE 21 MG/24HR TD PT24
21.0000 mg | MEDICATED_PATCH | Freq: Every day | TRANSDERMAL | Status: DC
  Filled 2019-04-01 (×3): qty 1

## 2019-04-01 MED ORDER — PIPERACILLIN-TAZOBACTAM 3.375 G IVPB
3.3750 g | Freq: Three times a day (TID) | INTRAVENOUS | Status: DC
  Administered 2019-04-01 – 2019-04-02 (×4): 3.375 g via INTRAVENOUS
  Filled 2019-04-01 (×3): qty 50

## 2019-04-01 MED ORDER — IOHEXOL 300 MG/ML  SOLN
100.0000 mL | Freq: Once | INTRAMUSCULAR | Status: AC | PRN
  Administered 2019-04-01: 01:00:00 100 mL via INTRAVENOUS

## 2019-04-01 MED ORDER — FENTANYL CITRATE (PF) 100 MCG/2ML IJ SOLN
25.0000 ug | INTRAMUSCULAR | Status: DC | PRN
  Administered 2019-04-01 – 2019-04-02 (×8): 25 ug via INTRAVENOUS
  Filled 2019-04-01 (×8): qty 2

## 2019-04-01 MED ORDER — HYDRALAZINE HCL 25 MG PO TABS: 25.0000 mg | ORAL_TABLET | Freq: Three times a day (TID) | ORAL | Status: DC | PRN

## 2019-04-01 MED ORDER — SODIUM CHLORIDE 0.9 % IV SOLN
INTRAVENOUS | Status: DC
  Administered 2019-04-01: 03:00:00 via INTRAVENOUS

## 2019-04-01 MED ORDER — SODIUM CHLORIDE 0.9 % IV BOLUS
1000.0000 mL | Freq: Once | INTRAVENOUS | Status: AC
  Administered 2019-04-01: 1000 mL via INTRAVENOUS

## 2019-04-01 MED ORDER — ONDANSETRON HCL 4 MG/2ML IJ SOLN
4.0000 mg | Freq: Three times a day (TID) | INTRAMUSCULAR | Status: DC | PRN
  Administered 2019-04-01: 4 mg via INTRAVENOUS
  Filled 2019-04-01: qty 2

## 2019-04-01 MED ORDER — MORPHINE SULFATE (PF) 4 MG/ML IV SOLN: 4.0000 mg | Freq: Once | INTRAVENOUS | Status: DC

## 2019-04-01 MED ORDER — SODIUM CHLORIDE (PF) 0.9 % IJ SOLN
INTRAMUSCULAR | Status: AC
  Administered 2019-04-01: 01:00:00
  Filled 2019-04-01: qty 50

## 2019-04-01 MED ORDER — ACETAMINOPHEN 325 MG PO TABS: 650.0000 mg | ORAL_TABLET | Freq: Four times a day (QID) | ORAL | Status: DC | PRN

## 2019-04-01 MED ORDER — PIPERACILLIN-TAZOBACTAM 3.375 G IVPB 30 MIN
3.3750 g | Freq: Once | INTRAVENOUS | Status: AC
  Administered 2019-04-01: 03:00:00 3.375 g via INTRAVENOUS
  Filled 2019-04-01: qty 50

## 2019-04-01 NOTE — H&P (Addendum)
History and Physical    Adrian Hess LPF:[id number] DOB: 1981/04/10 DOA: 03/31/2019  Referring MD/NP/PA:   PCP: Carlena Hurl, PA-C   Patient coming from:  The patient is coming from home.  At baseline, pt is independent for most of ADL.        Chief Complaint: Abdominal pain and constipation  HPI: Adrian Hess is a 38 y.o. male with medical history significant of hypertension, tobacco abuse, who presents with abdominal pain and constipation.  Patient states that he has been having constipation and abdominal pain for more than 1 week.  Abdominal pain is located in left lower quadrant, sharp, 9 out of 10 severity, nonradiating. He took laxatives and has been having daily, non-bloody bowel movements for the past 2-3 days.  He has nausea, but no vomiting or diarrhea.  Patient denies any chest pain, shortness breath, cough.  She has mild subjective fever, but his temperature is 99.9 in ED.  No chills.  He states that he has dysuria, no burning on urination.  ED Course: pt was found to have WBC 15.2, lipase 21, negative urinalysis, negative COVID-19 test, temperature 99.1, blood pressure 135/96, heart rate 108 --> 81, oxygen saturation 97% on room air. CT -abdomen/pelvis showed acute sigmoid diverticulitis with findings concerning for early abscess vs contained perforation. CT scan also showed multifocal patchy areas of subpleural ground-glass opacity in the left lower lobe and lingula. Pt is admitted to [place] bed as inpatient. General surgeon, Dr. Georgette Dover was consulted.   Review of Systems:   General: has subjetive fevers, no chills, no body weight gain, has poor appetite, has fatigue HEENT: no blurry vision, hearing changes or sore throat Respiratory: no dyspnea, coughing, wheezing CV: no chest pain, no palpitations GI: has nausea, abdominal pain,constipation, no vomiting, diarrhea,  GU: no dysuria, burning on urination, increased urinary frequency, hematuria  Ext: no leg edema Neuro: no  unilateral weakness, numbness, or tingling, no vision change or hearing loss Skin: no rash, no skin tear. MSK: No muscle spasm, no deformity, no limitation of range of movement in spin Heme: No easy bruising.  Travel history: No recent long distant travel.  Allergy:  Allergies  Allergen Reactions  . Percocet [Oxycodone-Acetaminophen] Hypertension    irriatable    Past Medical History:  Diagnosis Date  . Hypertension   . Insomnia   . Wears contact lenses     Past Surgical History:  Procedure Laterality Date  . KNEE SURGERY     right meniscus  . MANDIBLE FRACTURE SURGERY      Social History:  reports that he has been smoking cigars and cigarettes. He has never used smokeless tobacco. He reports current alcohol use of about 5.0 standard drinks of alcohol per week. He reports current drug use. Drug: Marijuana.  Family History:  Family History  Problem Relation Age of Onset  . Hypertension Mother   . Diabetes Mother   . Anxiety disorder Mother   . Depression Mother   . Diabetes Father   . Diabetes Sister   . Anxiety disorder Sister   . Cancer Maternal Grandmother   . Heart disease Neg Hx      Prior to Admission medications   Medication Sig Start Date End Date Taking? Authorizing Provider  aspirin EC 325 MG tablet Take 325 mg by mouth daily.   Yes [provider]  amLODipine (NORVASC) 5 MG tablet Take 1 tablet (5 mg total) by mouth daily. Take 1/2 tab for a week then increase  to 1 tab. Patient not taking: Reported on 11/07/2017 03/13/17   Ofilia Neas, PA-C  cyclobenzaprine (FLEXERIL) 10 MG tablet Take 1 tablet (10 mg total) by mouth 3 (three) times daily as needed for muscle spasms. Patient not taking: Reported on 03/31/2019 11/07/17   Georgina Quint, MD  metoCLOPramide (REGLAN) 5 MG tablet Take 1 tablet (5 mg total) by mouth 4 (four) times daily. 03/20/17 03/23/17  Doristine Bosworth, MD  oxyCODONE-acetaminophen (PERCOCET/ROXICET) 5-325 MG tablet Take 2  tablets by mouth every 4 (four) hours as needed for severe pain. Patient not taking: Reported on 03/20/2017 11/22/15   Jerre Simon, [place]    Physical Exam: Vitals:   03/31/19 2253 03/31/19 2330 04/01/19 0015 04/01/19 0030  BP: (!) 154/96 133/85 (!) 138/94 (!) 135/96  Pulse: 92 89 85 81  Resp: 17 18 18 18   Temp:      TempSrc:      SpO2: 99% 99% 99% 97%  Weight:      Height:       General: Not in acute distress HEENT:       Eyes: PERRL, EOMI, no scleral icterus.       ENT: No discharge from the ears and nose, no pharynx injection, no tonsillar enlargement.        Neck: No JVD, no bruit, no mass felt. Heme: No neck lymph node enlargement. Cardiac: S1/S2, RRR, No murmurs, No gallops or rubs. Respiratory: No rales, wheezing, rhonchi or rubs. GI: Soft, nondistended, has tenderness in LLQ, no rebound pain, no organomegaly, BS present. GU: No hematuria Ext: No pitting leg edema bilaterally. 2+DP/PT pulse bilaterally. Musculoskeletal: No joint deformities, No joint redness or warmth, no limitation of ROM in spin. Skin: No rashes.  Neuro: Alert, oriented X3, cranial nerves II-XII grossly intact, moves all extremities normally.  Psych: Patient is not psychotic, no suicidal or hemocidal ideation.  Labs on Admission: I have personally reviewed following labs and imaging studies  CBC: Recent Labs  Lab 03/31/19 1955  WBC 15.2*  HGB 14.3  HCT 44.8  MCV 95.3  PLT 257   Basic Metabolic Panel: Recent Labs  Lab 03/31/19 1955  NA 139  K 4.0  CL 103  CO2 25  GLUCOSE 94  BUN 15  CREATININE 0.97  CALCIUM 9.5   GFR: Estimated Creatinine Clearance: 137.6 mL/min (by C-G formula based on SCr of 0.97 mg/dL). Liver Function Tests: Recent Labs  Lab 03/31/19 1955  AST 25  ALT 41  ALKPHOS 83  BILITOT 0.9  PROT 8.2*  ALBUMIN 4.4   Recent Labs  Lab 03/31/19 1955  LIPASE 21   No results for input(s): AMMONIA in the last 168 hours. Coagulation Profile: No results for input(s):  INR, PROTIME in the last 168 hours. Cardiac Enzymes: No results for input(s): CKTOTAL, CKMB, CKMBINDEX, TROPONINI in the last 168 hours. BNP (last 3 results) No results for input(s): PROBNP in the last 8760 hours. HbA1C: No results for input(s): HGBA1C in the last 72 hours. CBG: No results for input(s): GLUCAP in the last 168 hours. Lipid Profile: No results for input(s): CHOL, HDL, LDLCALC, TRIG, CHOLHDL, LDLDIRECT in the last 72 hours. Thyroid Function Tests: No results for input(s): TSH, T4TOTAL, FREET4, T3FREE, THYROIDAB in the last 72 hours. Anemia Panel: No results for input(s): VITAMINB12, FOLATE, FERRITIN, TIBC, IRON, RETICCTPCT in the last 72 hours. Urine analysis:    Component Value Date/Time   COLORURINE YELLOW 03/31/2019 2255   APPEARANCEUR CLEAR 03/31/2019 2255  LABSPEC 1.025 03/31/2019 2255   PHURINE 5.0 03/31/2019 2255   GLUCOSEU NEGATIVE 03/31/2019 2255   HGBUR NEGATIVE 03/31/2019 2255   BILIRUBINUR NEGATIVE 03/31/2019 2255   BILIRUBINUR n 01/19/2013 0847   KETONESUR NEGATIVE 03/31/2019 2255   PROTEINUR 30 (A) 03/31/2019 2255   UROBILINOGEN 0.2 08/24/2013 0108   NITRITE NEGATIVE 03/31/2019 2255   LEUKOCYTESUR NEGATIVE 03/31/2019 2255   Sepsis Labs: (procalcitonin:4,lacticidven:4) )No results found for this or any previous visit (from the past 240 hour(s)).   Radiological Exams on Admission: Ct Abdomen Pelvis W Contrast  Result Date: 04/01/2019 CLINICAL DATA:  Abdominal pain, intermittent left abdominal pain and painful urination, leukocytosis EXAM: CT ABDOMEN AND PELVIS WITH CONTRAST TECHNIQUE: Multidetector CT imaging of the abdomen and pelvis was performed using the standard protocol following bolus administration of intravenous contrast. CONTRAST:  OMNIPAQUE IOHEXOL 300 MG/ML  SOLN COMPARISON:  Same day abdominal radiograph. FINDINGS: Lower chest: Multifocal patchy areas of subpleural ground-glass opacity are present in the left lower lobe  and lingula. More dependent ground-glass is noted posteriorly likely reflecting atelectasis. Normal heart size. No pericardial effusion. Hepatobiliary: No focal liver abnormality is seen. No gallstones, gallbladder wall thickening, or biliary dilatation. Pancreas: Unremarkable. No pancreatic ductal dilatation or surrounding inflammatory changes. Spleen: Normal in size without focal abnormality. Adrenals/Urinary Tract: Adrenal glands are unremarkable. Kidneys are normal, without renal calculi, focal lesion, or hydronephrosis. Mild thickening is noted in the adjacent the distal left ureter likely secondary to the stranding and inflammation in the left lower quadrant. Similarly there is asymmetric left bladder wall thickening. Stomach/Bowel: Distal esophagus, stomach and duodenal sweep are unremarkable. No small bowel wall thickening or dilatation. No evidence of obstruction. A normal appendix is visualized. Proximal colon is unremarkable. There is segmental thickening of the sigmoid colon centered upon several inflamed colonic diverticula and a hypoattenuating, ill-defined fluid collection extending from the inflamed posterior colonic wall. Extensive adjacent inflammation and phlegmonous changes present as well as reactive free fluid tracking along the paracolic gutter into the deep pelvis. No resulting obstruction. Vascular/Lymphatic: The aorta is normal caliber. Few reactive lymph nodes noted in the low mesentery. No pathologically enlarged nodes in the abdomen or pelvis Reproductive: The prostate and seminal vesicles are unremarkable. Other: Reactive free fluid phlegmonous change in the left lower quadrant. No free intraperitoneal air. Musculoskeletal: No acute osseous abnormality or suspicious osseous lesion. IMPRESSION: 1. Acute diverticulitis of the sigmoid colon centered upon several inflamed colonic diverticula and a hypoattenuating, ill-defined fluid collection extending from the inflamed posterior colonic  wall, concerning for contained perforation or possible early abscess formation. Extensive adjacent inflammation and phlegmonous changes present as well as reactive free fluid tracking along the paracolic gutter into the deep pelvis. No resulting obstruction. 2. Asymmetric left bladder wall thickening and left urothelial thickening, which may be reactive to the adjacent inflammatory process in the left lower quadrant. If urinary symptoms are present however consider evaluation with urinalysis. 3. Multifocal patchy areas of subpleural ground-glass opacity in the left lower lobe and lingula could reflect an acute infectious or inflammatory process. Electronically Signed   By: Kreg Shropshire M.D.   On: 04/01/2019 00:57     EKG:  Not done in ED, will get one.   Assessment/Plan Principal Problem:   Diverticulitis Active Problems:   Hypertension   Tobacco abuse   Diverticulitis-sigmoid: CT -abdomen/pelvis showed acute sigmoid diverticulitis with findings concerning for early abscess vs contained perforation.  Patient has leukocytosis, but no fever.  Currently hemodynamically stable.  General  surgeon, Dr. Corliss Skainssuei was consulted  -Admitted to Med[facility] bed as inpatient - IV Zosyn -PRN fentanyl for pain, Zofran for nausea -Blood culture -IV fluid: Normal saline 1 L, then 125 cc/h -INR/PTT/type & screen - NPO  Hypertension: bp 135/96. Not taking med now -prn oral hydralazine  Tobacco abuse  -Did counseling about importance of quitting smoking -Nicotine patch   Inpatient status:  # Patient requires inpatient status due to high intensity of service, high risk for further deterioration and high frequency of surveillance required.  I certify that at the point of admission it is my clinical judgment that the patient will require inpatient hospital care spanning beyond 2 midnights from the point of admission.  . This patient has multiple chronic comorbidities including HTN, and tobacco abuse . Now  patient has presenting with abdominal pain due to acute sigmoid diverticulitis with CT findings concerning for early abscess vs contained perforation. . The worrisome physical exam findings include LLQ abdominal tenderness . The initial radiographic and laboratory data are worrisome because of leukocytosis. CT scan showed acute sigmoid diverticulitis with findings concerning for early abscess vs contained perforation. . Current medical needs: please see my assessment and plan  . Predictability of an adverse outcome (risk): Patient presents with abdominal pain due to acute sigmoid diverticulitis.  CT findings are concerning for early stage of abscess versus contained perforation.  Patient will be treated with IV antibiotics, but if he deteriorates, patient may need surgery.  Will need to be treated in hospital for at least 2 days.    DVT ppx: SCD Code Status: Full code Family Communication: None at bed side.    Disposition Plan:  Anticipate discharge back to previous home environment Consults called:  Dr. Corliss Skainssuei of general surgeon Admission status:  medical floor/inpt  Date of Service 04/01/2019    Lorretta HarpXilin Alphonsine Minium [facility]   If 7PM-7AM, please contact night-coverage [email or web address] Password [email or web address] 04/01/2019, 2:44 AM

## 2019-04-01 NOTE — Progress Notes (Signed)
PROGRESS NOTE  Adrian Hess DXI:[id number] DOB: 17-Jun-1981   PCP: Jac Canavan, PA-C  Patient is from: Home  DOA: 03/31/2019 LOS: 0  Brief Narrative / Interim history: 38 year old male with history of HTN and tobacco use presenting with 1 week of abdominal pain, nausea and constipation with associated urinary symptoms and found to have acute sigmoid diverticulitis with finding concerning for early abscess versus contained perforation.  Started on IV Zosyn.  General surgery consulted  Subjective: No major events overnight of this morning.  Reports some improvement in his pain.  Still with some suprapubic discomfort with urination.  Denies dysuria or penile discharge.  Reports some nausea that he attributes to being hungry.  Denies chest pain, dyspnea or cough  Objective: Vitals:   04/01/19 0315 04/01/19 0430 04/01/19 0510 04/01/19 1355  BP: (!) 128/91 135/80 (!) 150/105 (!) 134/94  Pulse: 84 75 80 73  Resp: 18 20 18    Temp:   98.2 F (36.8 C) 98.2 F (36.8 C)  TempSrc:   Oral Oral  SpO2: 97% 99% 99% 97%  Weight:      Height:        Intake/Output Summary (Last 24 hours) at 04/01/2019 1406 Last data filed at 04/01/2019 0947 Gross per 24 hour  Intake 1050 ml  Output 0 ml  Net 1050 ml   Filed Weights   03/31/19 1939  Weight: 113.4 kg    Examination:  GENERAL: No acute distress.  Appears well.  HEENT: MMM.  Vision and hearing grossly intact.  NECK: Supple.  No apparent JVD.  RESP:  No IWOB. Good air movement bilaterally. CVS:  RRR. Heart sounds normal.  ABD/GI/GU: Bowel sounds present. Soft.  Tenderness over LLQ MSK/EXT:  Moves extremities. No apparent deformity or edema.  SKIN: no apparent skin lesion or wound NEURO: Awake, alert and oriented appropriately.  No gross deficit.  PSYCH: Calm. Normal affect.   Assessment & Plan: Acute sigmoid diverticulitis-first episode. -CT A/P revealed acute sigmoid diverticulitis with finding concerning for early abscess versus  contained perforation. -Mild leukocytosis improving. -Continue IV Zosyn, bowel rest and symptomatic management per general surgery.  Essential hypertension: BP slightly elevated. -Resume home amlodipine. -PRN hydralazine  Tobacco use disorder: -Encourage cessation -Nicotine patch  DVT prophylaxis: SCD Code Status: Full code Family Communication: Patient and/or RN. Available if any question. Disposition Plan: Remains inpatient pending improvement in pain and p.o. tolerance. Consultants: General surgery  Procedures:  None  Microbiology summarized: SARS-CoV-2 screen negative. Blood culture pending.  Antimicrobials: Anti-infectives (From admission, onward)   Start     Dose/Rate Route Frequency Ordered Stop   04/01/19 0800  piperacillin-tazobactam (ZOSYN) IVPB 3.375 g     3.375 g 12.5 mL/hr over 240 Minutes Intravenous Every 8 hours 04/01/19 0235     04/01/19 0130  piperacillin-tazobactam (ZOSYN) IVPB 3.375 g     3.375 g 100 mL/hr over 30 Minutes Intravenous  Once 04/01/19 0129 04/01/19 0413      Sch Meds:  Scheduled Meds: . nicotine  21 mg Transdermal Daily   Continuous Infusions: . sodium chloride 125 mL/hr at 04/01/19 0254  . piperacillin-tazobactam (ZOSYN)  IV 3.375 g (04/01/19 0808)   PRN Meds:.acetaminophen **OR** acetaminophen, fentaNYL (SUBLIMAZE) injection, hydrALAZINE, ondansetron   I have personally reviewed the following labs and images: CBC: Recent Labs  Lab 03/31/19 1955 04/01/19 0432  WBC 15.2* 10.6*  HGB 14.3 13.0  HCT 44.8 40.6  MCV 95.3 94.9  PLT 257 213   BMP &GFR Recent Labs  Lab 03/31/19 1955 04/01/19 0432  NA 139 136  K 4.0 3.8  CL 103 104  CO2 25 25  GLUCOSE 94 117*  BUN 15 12  CREATININE 0.97 1.05  CALCIUM 9.5 8.4*   Estimated Creatinine Clearance: 127.1 mL/min (by C-G formula based on SCr of 1.05 mg/dL). Liver & Pancreas: Recent Labs  Lab 03/31/19 1955  AST 25  ALT 41  ALKPHOS 83  BILITOT 0.9  PROT 8.2*  ALBUMIN  4.4   Recent Labs  Lab 03/31/19 1955  LIPASE 21   No results for input(s): AMMONIA in the last 168 hours. Diabetic: No results for input(s): HGBA1C in the last 72 hours. Recent Labs  Lab 04/01/19 0732  GLUCAP 86   Cardiac Enzymes: No results for input(s): CKTOTAL, CKMB, CKMBINDEX, TROPONINI in the last 168 hours. No results for input(s): PROBNP in the last 8760 hours. Coagulation Profile: Recent Labs  Lab 04/01/19 0432  INR 1.1   Thyroid Function Tests: No results for input(s): TSH, T4TOTAL, FREET4, T3FREE, THYROIDAB in the last 72 hours. Lipid Profile: No results for input(s): CHOL, HDL, LDLCALC, TRIG, CHOLHDL, LDLDIRECT in the last 72 hours. Anemia Panel: No results for input(s): VITAMINB12, FOLATE, FERRITIN, TIBC, IRON, RETICCTPCT in the last 72 hours. Urine analysis:    Component Value Date/Time   COLORURINE YELLOW 03/31/2019 2255   APPEARANCEUR CLEAR 03/31/2019 2255   LABSPEC 1.025 03/31/2019 2255   PHURINE 5.0 03/31/2019 2255   GLUCOSEU NEGATIVE 03/31/2019 2255   HGBUR NEGATIVE 03/31/2019 2255   BILIRUBINUR NEGATIVE 03/31/2019 2255   BILIRUBINUR n 01/19/2013 [place] 03/31/2019 2255   PROTEINUR 30 (A) 03/31/2019 2255   UROBILINOGEN 0.2 08/24/2013 0108   NITRITE NEGATIVE 03/31/2019 2255   LEUKOCYTESUR NEGATIVE 03/31/2019 2255   Sepsis Labs: Invalid input(s): PROCALCITONIN, [place]  Microbiology: Recent Results (from the past 240 hour(s))  SARS Coronavirus 2 [facility] order, Performed in [facility] hospital lab) Nasopharyngeal Nasopharyngeal Swab     Status: None   Collection Time: 04/01/19  2:47 AM   Specimen: Nasopharyngeal Swab  Result Value Ref Range Status   SARS Coronavirus 2 NEGATIVE NEGATIVE Final    Comment: (NOTE) If result is NEGATIVE SARS-CoV-2 target nucleic acids are NOT DETECTED. The SARS-CoV-2 RNA is generally detectable in upper and lower  respiratory specimens during the acute phase of infection. The lowest   concentration of SARS-CoV-2 viral copies this assay can detect is 250  copies / mL. A negative result does not preclude SARS-CoV-2 infection  and should not be used as the sole basis for treatment or other  patient management decisions.  A negative result may occur with  improper specimen collection / handling, submission of specimen other  than nasopharyngeal swab, presence of viral mutation(s) within the  areas targeted by this assay, and inadequate number of viral copies  (<250 copies / mL). A negative result must be combined with clinical  observations, patient history, and epidemiological information. If result is POSITIVE SARS-CoV-2 target nucleic acids are DETECTED. The SARS-CoV-2 RNA is generally detectable in upper and lower  respiratory specimens dur ing the acute phase of infection.  Positive  results are indicative of active infection with SARS-CoV-2.  Clinical  correlation with patient history and other diagnostic information is  necessary to determine patient infection status.  Positive results do  not rule out bacterial infection or co-infection with other viruses. If result is PRESUMPTIVE POSTIVE SARS-CoV-2 nucleic acids MAY BE PRESENT.   A presumptive positive result was obtained  on the submitted specimen  and confirmed on repeat testing.  While 2019 novel coronavirus  (SARS-CoV-2) nucleic acids may be present in the submitted sample  additional confirmatory testing may be necessary for epidemiological  and / or clinical management purposes  to differentiate between  SARS-CoV-2 and other Sarbecovirus currently known to infect humans.  If clinically indicated additional testing with an alternate test  methodology [phone] is advised. The SARS-CoV-2 RNA is generally  detectable in upper and lower respiratory sp ecimens during the acute  phase of infection. The expected result is Negative. Fact Sheet for Patients:  BoilerBrush.com.cyhttps://www.fda.gov/media/136312/download Fact Sheet  for Healthcare Providers: https://pope.com/https://www.fda.gov/media/136313/download This test is not yet approved or cleared by the [place] FDA and has been authorized for detection and/or diagnosis of SARS-CoV-2 by FDA under an Emergency Use Authorization (EUA).  This EUA will remain in effect (meaning this test can be used) for the duration of the COVID-19 declaration under Section 564(b)(1) of the Act, 21 U.S.C. section 360bbb-3(b)(1), unless the authorization is terminated or revoked sooner. Performed at [facility], [address] [place], [place] [address]     Radiology Studies: Ct Abdomen Pelvis W Contrast  Result Date: 04/01/2019 CLINICAL DATA:  Abdominal pain, intermittent left abdominal pain and painful urination, leukocytosis EXAM: CT ABDOMEN AND PELVIS WITH CONTRAST TECHNIQUE: Multidetector CT imaging of the abdomen and pelvis was performed using the standard protocol following bolus administration of intravenous contrast. CONTRAST:  100mL OMNIPAQUE IOHEXOL 300 MG/ML  SOLN COMPARISON:  Same day abdominal radiograph. FINDINGS: Lower chest: Multifocal patchy areas of subpleural ground-glass opacity are present in the left lower lobe and lingula. More dependent ground-glass is noted posteriorly likely reflecting atelectasis. Normal heart size. No pericardial effusion. Hepatobiliary: No focal liver abnormality is seen. No gallstones, gallbladder wall thickening, or biliary dilatation. Pancreas: Unremarkable. No pancreatic ductal dilatation or surrounding inflammatory changes. Spleen: Normal in size without focal abnormality. Adrenals/Urinary Tract: Adrenal glands are unremarkable. Kidneys are normal, without renal calculi, focal lesion, or hydronephrosis. Mild thickening is noted in the adjacent the distal left ureter likely secondary to the stranding and inflammation in the left lower quadrant. Similarly there is asymmetric left bladder wall thickening. Stomach/Bowel: Distal  esophagus, stomach and duodenal sweep are unremarkable. No small bowel wall thickening or dilatation. No evidence of obstruction. A normal appendix is visualized. Proximal colon is unremarkable. There is segmental thickening of the sigmoid colon centered upon several inflamed colonic diverticula and a hypoattenuating, ill-defined fluid collection extending from the inflamed posterior colonic wall. Extensive adjacent inflammation and phlegmonous changes present as well as reactive free fluid tracking along the paracolic gutter into the deep pelvis. No resulting obstruction. Vascular/Lymphatic: The aorta is normal caliber. Few reactive lymph nodes noted in the low mesentery. No pathologically enlarged nodes in the abdomen or pelvis Reproductive: The prostate and seminal vesicles are unremarkable. Other: Reactive free fluid phlegmonous change in the left lower quadrant. No free intraperitoneal air. Musculoskeletal: No acute osseous abnormality or suspicious osseous lesion. IMPRESSION: 1. Acute diverticulitis of the sigmoid colon centered upon several inflamed colonic diverticula and a hypoattenuating, ill-defined fluid collection extending from the inflamed posterior colonic wall, concerning for contained perforation or possible early abscess formation. Extensive adjacent inflammation and phlegmonous changes present as well as reactive free fluid tracking along the paracolic gutter into the deep pelvis. No resulting obstruction. 2. Asymmetric left bladder wall thickening and left urothelial thickening, which may be reactive to the adjacent inflammatory process in the left lower quadrant. If  urinary symptoms are present however consider evaluation with urinalysis. 3. Multifocal patchy areas of subpleural ground-glass opacity in the left lower lobe and lingula could reflect an acute infectious or inflammatory process. Electronically Signed   By: Kreg Shropshire M.D.   On: 04/01/2019 00:57     T.  [facility]  If 7PM-7AM, please contact night-coverage [email or web address] Password [email or web address] 04/01/2019, 2:06 PM

## 2019-04-01 NOTE — ED Notes (Signed)
Patient transported to CT 

## 2019-04-01 NOTE — ED Notes (Signed)
Blood cultures obtained prior to starting antibiotics

## 2019-04-01 NOTE — ED Notes (Signed)
ED TO INPATIENT HANDOFF REPORT  Name/Age/Gender Adrian Hess 38 y.o. male  Code Status    Code Status Orders  (From admission, onward)         Start     Ordered   04/01/19 0302  Full code  Continuous     04/01/19 0301        Code Status History    This patient has a current code status but no historical code status.   Advance Care Planning Activity      Home/SNF/Other Home  Chief Complaint Abdominal Pain  Level of Care/Admitting Diagnosis ED Disposition    ED Disposition Condition Comment   Admit  Hospital Area: [facility] [[id number]]  Level of Care: Med-Surg [16]  Covid Evaluation: Person Under Investigation (PUI)  Diagnosis: Diverticulitis [696295]  Admitting Physician: Lorretta Harp [[id number]]  Attending Physician: Lorretta Harp [phone]  Estimated length of stay: past midnight tomorrow  Certification:: I certify this patient will need inpatient services for at least 2 midnights  PT Class (Do Not Modify): Inpatient [101]  PT Acc Code (Do Not Modify): Private [1]       Medical History Past Medical History:  Diagnosis Date  . Hypertension   . Insomnia   . Wears contact lenses     Allergies Allergies  Allergen Reactions  . Percocet [Oxycodone-Acetaminophen] Hypertension    irriatable    IV Location/Drains/Wounds Patient Lines/Drains/Airways Status   Active Line/Drains/Airways    Name:   Placement date:   Placement time:   Site:   Days:   Peripheral IV 04/01/19 Left Antecubital   04/01/19    0014    Antecubital   less than 1          Labs/Imaging Results for orders placed or performed during the hospital encounter of 03/31/19 (from the past 48 hour(s))  Lipase, blood     Status: None   Collection Time: 03/31/19  7:55 PM  Result Value Ref Range   Lipase 21 11 - 51 U/L    Comment: Performed at [facility], [address], [place], [place] [address]  Comprehensive metabolic panel     Status: Abnormal   Collection  Time: 03/31/19  7:55 PM  Result Value Ref Range   Sodium 139 135 - 145 mmol/L   Potassium 4.0 3.5 - 5.1 mmol/L   Chloride 103 98 - 111 mmol/L   CO2 25 22 - 32 mmol/L   Glucose, Bld 94 70 - 99 mg/dL   BUN 15 6 - 20 mg/dL   Creatinine, Ser 1.02 0.61 - 1.24 mg/dL   Calcium 9.5 8.9 - 72.5 mg/dL   Total Protein 8.2 (H) 6.5 - 8.1 g/dL   Albumin 4.4 3.5 - 5.0 g/dL   AST 25 15 - 41 U/L   ALT 41 0 - 44 U/L   Alkaline Phosphatase 83 38 - 126 U/L   Total Bilirubin 0.9 0.3 - 1.2 mg/dL   GFR calc non Af Amer >60 >60 mL/min   GFR calc Af Amer >60 >60 mL/min   Anion gap 11 5 - 15    Comment: Performed at [facility], [address] [place], [place] [address]  CBC     Status: Abnormal   Collection Time: 03/31/19  7:55 PM  Result Value Ref Range   WBC 15.2 (H) 4.0 - 10.5 K/uL   RBC 4.70 4.22 - 5.81 MIL/uL   Hemoglobin 14.3 13.0 - 17.0 g/dL   HCT 44.8  39.0 - 52.0 %   MCV 95.3 80.0 - 100.0 fL   MCH 30.4 26.0 - 34.0 pg   MCHC 31.9 30.0 - 36.0 g/dL   RDW 34.1 93.7 - 90.2 %   Platelets 257 150 - 400 K/uL   nRBC 0.0 0.0 - 0.2 %    Comment: Performed at [facility], [address] [place], [place] [address]  Urinalysis, Routine w reflex microscopic     Status: Abnormal   Collection Time: 03/31/19 10:55 PM  Result Value Ref Range   Color, Urine YELLOW YELLOW   APPearance CLEAR CLEAR   Specific Gravity, Urine 1.025 1.005 - 1.030   pH 5.0 5.0 - 8.0   Glucose, UA NEGATIVE NEGATIVE mg/dL   Hgb urine dipstick NEGATIVE NEGATIVE   Bilirubin Urine NEGATIVE NEGATIVE   Ketones, ur NEGATIVE NEGATIVE mg/dL   Protein, ur 30 (A) NEGATIVE mg/dL   Nitrite NEGATIVE NEGATIVE   Leukocytes,Ua NEGATIVE NEGATIVE   RBC / HPF 0-5 0 - 5 RBC/hpf   WBC, UA 0-5 0 - 5 WBC/hpf   Bacteria, UA RARE (A) NONE SEEN   Squamous Epithelial / LPF 0-5 0 - 5   Mucus PRESENT     Comment: Performed at [facility], [address] [place], [place] [address]  SARS  Coronavirus 2 [facility] order, Performed in [facility] hospital lab) Nasopharyngeal Nasopharyngeal Swab     Status: None   Collection Time: 04/01/19  2:47 AM   Specimen: Nasopharyngeal Swab  Result Value Ref Range   SARS Coronavirus 2 NEGATIVE NEGATIVE    Comment: (NOTE) If result is NEGATIVE SARS-CoV-2 target nucleic acids are NOT DETECTED. The SARS-CoV-2 RNA is generally detectable in upper and lower  respiratory specimens during the acute phase of infection. The lowest  concentration of SARS-CoV-2 viral copies this assay can detect is 250  copies / mL. A negative result does not preclude SARS-CoV-2 infection  and should not be used as the sole basis for treatment or other  patient management decisions.  A negative result may occur with  improper specimen collection / handling, submission of specimen other  than nasopharyngeal swab, presence of viral mutation(s) within the  areas targeted by this assay, and inadequate number of viral copies  (<250 copies / mL). A negative result must be combined with clinical  observations, patient history, and epidemiological information. If result is POSITIVE SARS-CoV-2 target nucleic acids are DETECTED. The SARS-CoV-2 RNA is generally detectable in upper and lower  respiratory specimens dur ing the acute phase of infection.  Positive  results are indicative of active infection with SARS-CoV-2.  Clinical  correlation with patient history and other diagnostic information is  necessary to determine patient infection status.  Positive results do  not rule out bacterial infection or co-infection with other viruses. If result is PRESUMPTIVE POSTIVE SARS-CoV-2 nucleic acids MAY BE PRESENT.   A presumptive positive result was obtained on the submitted specimen  and confirmed on repeat testing.  While 2019 novel coronavirus  (SARS-CoV-2) nucleic acids may be present in the submitted sample  additional confirmatory testing may be necessary for  epidemiological  and / or clinical management purposes  to differentiate between  SARS-CoV-2 and other Sarbecovirus currently known to infect humans.  If clinically indicated additional testing with an alternate test  methodology [phone]) is advised. The SARS-CoV-2 RNA is generally  detectable in upper and lower respiratory sp ecimens during the acute  phase of infection. The expected result is  Negative. Fact Sheet for Patients:  StrictlyIdeas.no Fact Sheet for Healthcare Providers: BankingDealers.co.za This test is not yet approved or cleared by the [place] FDA and has been authorized for detection and/or diagnosis of SARS-CoV-2 by FDA under an Emergency Use Authorization (EUA).  This EUA will remain in effect (meaning this test can be used) for the duration of the COVID-19 declaration under Section 564(b)(1) of the Act, 21 U.S.C. section 360bbb-3(b)(1), unless the authorization is terminated or revoked sooner. Performed at [facility], [place] [address], [place], [place] [address]    Ct Abdomen Pelvis W Contrast  Result Date: 04/01/2019 CLINICAL DATA:  Abdominal pain, intermittent left abdominal pain and painful urination, leukocytosis EXAM: CT ABDOMEN AND PELVIS WITH CONTRAST TECHNIQUE: Multidetector CT imaging of the abdomen and pelvis was performed using the standard protocol following bolus administration of intravenous contrast. CONTRAST:  165mL OMNIPAQUE IOHEXOL 300 MG/ML  SOLN COMPARISON:  Same day abdominal radiograph. FINDINGS: Lower chest: Multifocal patchy areas of subpleural ground-glass opacity are present in the left lower lobe and lingula. More dependent ground-glass is noted posteriorly likely reflecting atelectasis. Normal heart size. No pericardial effusion. Hepatobiliary: No focal liver abnormality is seen. No gallstones, gallbladder wall thickening, or biliary dilatation. Pancreas: Unremarkable. No  pancreatic ductal dilatation or surrounding inflammatory changes. Spleen: Normal in size without focal abnormality. Adrenals/Urinary Tract: Adrenal glands are unremarkable. Kidneys are normal, without renal calculi, focal lesion, or hydronephrosis. Mild thickening is noted in the adjacent the distal left ureter likely secondary to the stranding and inflammation in the left lower quadrant. Similarly there is asymmetric left bladder wall thickening. Stomach/Bowel: Distal esophagus, stomach and duodenal sweep are unremarkable. No small bowel wall thickening or dilatation. No evidence of obstruction. A normal appendix is visualized. Proximal colon is unremarkable. There is segmental thickening of the sigmoid colon centered upon several inflamed colonic diverticula and a hypoattenuating, ill-defined fluid collection extending from the inflamed posterior colonic wall. Extensive adjacent inflammation and phlegmonous changes present as well as reactive free fluid tracking along the paracolic gutter into the deep pelvis. No resulting obstruction. Vascular/Lymphatic: The aorta is normal caliber. Few reactive lymph nodes noted in the low mesentery. No pathologically enlarged nodes in the abdomen or pelvis Reproductive: The prostate and seminal vesicles are unremarkable. Other: Reactive free fluid phlegmonous change in the left lower quadrant. No free intraperitoneal air. Musculoskeletal: No acute osseous abnormality or suspicious osseous lesion. IMPRESSION: 1. Acute diverticulitis of the sigmoid colon centered upon several inflamed colonic diverticula and a hypoattenuating, ill-defined fluid collection extending from the inflamed posterior colonic wall, concerning for contained perforation or possible early abscess formation. Extensive adjacent inflammation and phlegmonous changes present as well as reactive free fluid tracking along the paracolic gutter into the deep pelvis. No resulting obstruction. 2. Asymmetric left  bladder wall thickening and left urothelial thickening, which may be reactive to the adjacent inflammatory process in the left lower quadrant. If urinary symptoms are present however consider evaluation with urinalysis. 3. Multifocal patchy areas of subpleural ground-glass opacity in the left lower lobe and lingula could reflect an acute infectious or inflammatory process. Electronically Signed   By: Lovena Le M.D.   On: 04/01/2019 00:57    Pending Labs Unresulted Labs (From admission, onward)    Start     Ordered   04/01/19 0254  Basic metabolic panel  Tomorrow morning,   R     04/01/19 0301   04/01/19 0500  CBC  Tomorrow morning,   R  04/01/19 0301   04/01/19 0302  Protime-INR  Once,   STAT     04/01/19 0301   04/01/19 0302  APTT  Once,   STAT     04/01/19 0301   04/01/19 0302  Type and screen [facility]  Once,   STAT    Comments[facility]    04/01/19 0301   04/01/19 0302  HIV Antibody (routine testing w rflx)  (HIV Antibody (Routine testing w reflex) panel)  Once,   STAT     04/01/19 0301   04/01/19 0302  HIV4GL Save Tube  (HIV Antibody (Routine testing w reflex) panel)  Once,   STAT     04/01/19 0301   04/01/19 0302  Lactic acid, plasma  STAT Now then every 2 hours,   STAT     04/01/19 0301   04/01/19 0302  Procalcitonin  ONCE - STAT,   STAT     04/01/19 0301   04/01/19 0214  Culture, blood (Routine X 2) w Reflex to ID Panel  BLOOD CULTURE X 2,   R (with STAT occurrences)    Comments: Please obtain prior to antibiotic administration.    04/01/19 0214          Vitals/Pain Today's Vitals   04/01/19 0030 04/01/19 0250 04/01/19 0310 04/01/19 0315  BP: (!) 135/96  (!) 126/92 (!) 128/91  Pulse: 81  78 84  Resp: 18  18 18   Temp:   98.2 F (36.8 C)   TempSrc:   Oral   SpO2: 97%  95% 97%  Weight:      Height:      PainSc:  7       Isolation Precautions No active isolations  Medications Medications  fentaNYL (SUBLIMAZE)  injection 25 mcg (25 mcg Intravenous Given 04/01/19 0250)  ondansetron (ZOFRAN) injection 4 mg (4 mg Intravenous Given 04/01/19 0250)  nicotine (NICODERM CQ - dosed in mg/24 hours) patch 21 mg (has no administration in time range)  acetaminophen (TYLENOL) tablet 650 mg (has no administration in time range)    Or  acetaminophen (TYLENOL) suppository 650 mg (has no administration in time range)  hydrALAZINE (APRESOLINE) tablet 25 mg (has no administration in time range)  piperacillin-tazobactam (ZOSYN) IVPB 3.375 g (has no administration in time range)  0.9 %  sodium chloride infusion ( Intravenous New Bag/Given 04/01/19 0254)  sodium chloride flush (NS) 0.9 % injection 3 mL (3 mLs Intravenous Given 04/01/19 0015)  fentaNYL (SUBLIMAZE) injection 50 mcg (50 mcg Intravenous Given 04/01/19 0015)  iohexol (OMNIPAQUE) 300 MG/ML solution 100 mL (100 mLs Intravenous Contrast Given 04/01/19 0034)  sodium chloride (PF) 0.9 % injection (  Given by Other 04/01/19 0037)  piperacillin-tazobactam (ZOSYN) IVPB 3.375 g (0 g Intravenous Stopped 04/01/19 0413)  sodium chloride 0.9 % bolus 1,000 mL (0 mLs Intravenous Stopped 04/01/19 0413)    Mobility walks

## 2019-04-01 NOTE — Consult Note (Signed)
[facility] Surgery Consult Note  Adrian Hess 1980-07-28  [id number].    Requesting MD: Candelaria Stagers Chief Complaint:constipated , abdominal pain going to LLQ, pain voiding Reason for Consult: Diverticulitis  HPI:  Patient is a 38 year old male presented to the ED last evening with constipation, abdominal pain extending to his left lower quadrant now, and pain voiding.  He reports pain for about 1 week.  He took laxatives and has been having daily bowel movements but ongoing abdominal pain.  Work-up in the ED is afebrile, T-max 99.9.  Admission blood pressure 135/96.  Heart rate in the 80s - 92 range.  Labs shows WBC 15.2, hemoglobin 14.3, hematocrit 44.8, platelets 257,000.  CMP was normal.  CT of the abdomen pelvis with contrast: Some dependent atelectasis left lower lobe and lingula.  Mild thickening in the distal left ureter, and asymmetric left bladder wall thickening.  Acute diverticulitis of the sigmoid center upon several inflamed colonic diverticuli and hypo-attenuating ill-defined fluid collections extending from the inflamed posterior colonic wall concerning for perforation or possible early abscess formation.  Patient was admitted to the Medicine service.  Patient was started on Zosyn 0 300 this AM.     ROS: Review of Systems  Constitutional: Positive for fever (99.9 in ED). Negative for chills, diaphoresis, malaise/fatigue and weight loss.  HENT: Negative.   Eyes: Negative.   Respiratory: Negative.   Cardiovascular: Negative.   Gastrointestinal: Positive for abdominal pain, constipation, diarrhea and heartburn. Negative for blood in stool, nausea and vomiting.  Genitourinary: Positive for dysuria.       No air with voiding just pain  Musculoskeletal: Negative.   Skin: Negative.   Endo/Heme/Allergies: Negative.   Psychiatric/Behavioral: Negative.     Family History  Problem Relation Age of Onset  . Hypertension Mother   . Diabetes Mother   . Anxiety disorder  Mother   . Depression Mother   . Diabetes Father   . Diabetes Sister   . Anxiety disorder Sister   . Cancer Maternal Grandmother   . Heart disease Neg Hx     Past Medical History:  Diagnosis Date  . Hypertension   . Insomnia   . Wears contact lenses     Past Surgical History:  Procedure Laterality Date  . KNEE SURGERY     right meniscus  . MANDIBLE FRACTURE SURGERY      Social History:  reports that he has been smoking cigars and cigarettes. He has never used smokeless tobacco. He reports current alcohol use of about 5.0 standard drinks of alcohol per week. He reports current drug use. Drug: Marijuana. Married EtOH: Social Drugs: Occasional marijuana Tobacco: About 1 cigarette/day after work x12 years He has a Pensions consultant from [employer]  Allergies:  Allergies  Allergen Reactions  . Percocet [Oxycodone-Acetaminophen] Hypertension    irriatable    Medications Prior to Admission  Medication Sig Dispense Refill  . aspirin EC 325 MG tablet Take 325 mg by mouth daily.    Marland Kitchen amLODipine (NORVASC) 5 MG tablet Take 1 tablet (5 mg total) by mouth daily. Take 1/2 tab for a week then increase to 1 tab. (Patient not taking: Reported on 11/07/2017) 90 tablet 1  . cyclobenzaprine (FLEXERIL) 10 MG tablet Take 1 tablet (10 mg total) by mouth 3 (three) times daily as needed for muscle spasms. (Patient not taking: Reported on 03/31/2019) 30 tablet 0  . metoCLOPramide (REGLAN) 5 MG tablet Take 1 tablet (5 mg total) by mouth 4 (four) times daily. 12  tablet 0  . oxyCODONE-acetaminophen (PERCOCET/ROXICET) 5-325 MG tablet Take 2 tablets by mouth every 4 (four) hours as needed for severe pain. (Patient not taking: Reported on 03/20/2017) 16 tablet 0    Blood pressure (!) 150/105, pulse 80, temperature 98.2 F (36.8 C), temperature source Oral, resp. rate 18, height 6\' 1"  (1.854 m), weight 113.4 kg, SpO2 99 %. Physical Exam: Physical Exam Vitals signs reviewed.  Constitutional:       Appearance: He is well-developed and normal weight.  HENT:     Head: Normocephalic and atraumatic.     Mouth/Throat:     Mouth: Mucous membranes are moist.     Pharynx: Oropharynx is clear.  Cardiovascular:     Rate and Rhythm: Normal rate and regular rhythm.     Heart sounds: Normal heart sounds. No murmur.  Pulmonary:     Effort: Pulmonary effort is normal. No respiratory distress.     Breath sounds: Normal breath sounds. No stridor. No wheezing, rhonchi or rales.  Chest:     Chest wall: No tenderness.  Abdominal:     General: Abdomen is flat. There is no distension.     Palpations: Abdomen is soft.     Tenderness: There is abdominal tenderness in the left lower quadrant.     Hernia: A hernia is present. Hernia is present in the umbilical area.  Skin:    General: Skin is warm.     Capillary Refill: Capillary refill takes less than 2 seconds.  Neurological:     General: No focal deficit present.     Mental Status: He is alert and oriented to person, place, and time.     Cranial Nerves: No cranial nerve deficit.  Psychiatric:        Mood and Affect: Mood normal. Mood is not anxious or depressed.        Behavior: Behavior normal.     Results for orders placed or performed during the hospital encounter of 03/31/19 (from the past 48 hour(s))  Lipase, blood     Status: None   Collection Time: 03/31/19  7:55 PM  Result Value Ref Range   Lipase 21 11 - 51 U/L    Comment: Performed at [facility], [address] [place], [place] [id number]  Comprehensive metabolic panel     Status: Abnormal   Collection Time: 03/31/19  7:55 PM  Result Value Ref Range   Sodium 139 135 - 145 mmol/L   Potassium 4.0 3.5 - 5.1 mmol/L   Chloride 103 98 - 111 mmol/L   CO2 25 22 - 32 mmol/L   Glucose, Bld 94 70 - 99 mg/dL   BUN 15 6 - 20 mg/dL   Creatinine, Ser 1.910.97 0.61 - 1.24 mg/dL   Calcium 9.5 8.9 - 47.810.3 mg/dL   Total Protein 8.2 (H) 6.5 - 8.1 g/dL   Albumin 4.4 3.5 - 5.0  g/dL   AST 25 15 - 41 U/L   ALT 41 0 - 44 U/L   Alkaline Phosphatase 83 38 - 126 U/L   Total Bilirubin 0.9 0.3 - 1.2 mg/dL   GFR calc non Af Amer >60 >60 mL/min   GFR calc Af Amer >60 >60 mL/min   Anion gap 11 5 - 15    Comment: Performed at [facility], [address] [place], [place] [address]  CBC     Status: Abnormal   Collection Time: 03/31/19  7:55 PM  Result Value Ref Range  WBC 15.2 (H) 4.0 - 10.5 K/uL   RBC 4.70 4.22 - 5.81 MIL/uL   Hemoglobin 14.3 13.0 - 17.0 g/dL   HCT 16.1 09.6 - 04.5 %   MCV 95.3 80.0 - 100.0 fL   MCH 30.4 26.0 - 34.0 pg   MCHC 31.9 30.0 - 36.0 g/dL   RDW 40.9 81.1 - 91.4 %   Platelets 257 150 - 400 K/uL   nRBC 0.0 0.0 - 0.2 %    Comment: Performed at [facility], [address], [place], [place] [address]  Urinalysis, Routine w reflex microscopic     Status: Abnormal   Collection Time: 03/31/19 10:55 PM  Result Value Ref Range   Color, Urine YELLOW YELLOW   APPearance CLEAR CLEAR   Specific Gravity, Urine 1.025 1.005 - 1.030   pH 5.0 5.0 - 8.0   Glucose, UA NEGATIVE NEGATIVE mg/dL   Hgb urine dipstick NEGATIVE NEGATIVE   Bilirubin Urine NEGATIVE NEGATIVE   Ketones, ur NEGATIVE NEGATIVE mg/dL   Protein, ur 30 (A) NEGATIVE mg/dL   Nitrite NEGATIVE NEGATIVE   Leukocytes,Ua NEGATIVE NEGATIVE   RBC / HPF 0-5 0 - 5 RBC/hpf   WBC, UA 0-5 0 - 5 WBC/hpf   Bacteria, UA RARE (A) NONE SEEN   Squamous Epithelial / LPF 0-5 0 - 5   Mucus PRESENT     Comment: Performed at [facility], [address], [place], [place] [address]  SARS Coronavirus 2 [facility] order, Performed in [facility] hospital lab) Nasopharyngeal Nasopharyngeal Swab     Status: None   Collection Time: 04/01/19  2:47 AM   Specimen: Nasopharyngeal Swab  Result Value Ref Range   SARS Coronavirus 2 NEGATIVE NEGATIVE    Comment: (NOTE) If result is NEGATIVE SARS-CoV-2 target nucleic acids are NOT DETECTED. The SARS-CoV-2 RNA  is generally detectable in upper and lower  respiratory specimens during the acute phase of infection. The lowest  concentration of SARS-CoV-2 viral copies this assay can detect is 250  copies / mL. A negative result does not preclude SARS-CoV-2 infection  and should not be used as the sole basis for treatment or other  patient management decisions.  A negative result may occur with  improper specimen collection / handling, submission of specimen other  than nasopharyngeal swab, presence of viral mutation(s) within the  areas targeted by this assay, and inadequate number of viral copies  (<250 copies / mL). A negative result must be combined with clinical  observations, patient history, and epidemiological information. If result is POSITIVE SARS-CoV-2 target nucleic acids are DETECTED. The SARS-CoV-2 RNA is generally detectable in upper and lower  respiratory specimens dur ing the acute phase of infection.  Positive  results are indicative of active infection with SARS-CoV-2.  Clinical  correlation with patient history and other diagnostic information is  necessary to determine patient infection status.  Positive results do  not rule out bacterial infection or co-infection with other viruses. If result is PRESUMPTIVE POSTIVE SARS-CoV-2 nucleic acids MAY BE PRESENT.   A presumptive positive result was obtained on the submitted specimen  and confirmed on repeat testing.  While 2019 novel coronavirus  (SARS-CoV-2) nucleic acids may be present in the submitted sample  additional confirmatory testing may be necessary for epidemiological  and / or clinical management purposes  to differentiate between  SARS-CoV-2 and other Sarbecovirus currently known to infect humans.  If clinically indicated additional testing with an alternate test  methodology [id number]) is  advised. The SARS-CoV-2 RNA is generally  detectable in upper and lower respiratory sp ecimens during the acute  phase of  infection. The expected result is Negative. Fact Sheet for Patients:  BoilerBrush.com.cy Fact Sheet for Healthcare Providers: https://pope.com/ This test is not yet approved or cleared by the [place] FDA and has been authorized for detection and/or diagnosis of SARS-CoV-2 by FDA under an Emergency Use Authorization (EUA).  This EUA will remain in effect (meaning this test can be used) for the duration of the COVID-19 declaration under Section 564(b)(1) of the Act, 21 U.S.C. section 360bbb-3(b)(1), unless the authorization is terminated or revoked sooner. Performed at [facility], [address] [place], [place] [address]   Protime-INR     Status: None   Collection Time: 04/01/19  4:32 AM  Result Value Ref Range   Prothrombin Time 13.7 11.4 - 15.2 seconds   INR 1.1 0.8 - 1.2    Comment: (NOTE) INR goal varies based on device and disease states. Performed at [facility], [address] [place], [place] [address]   APTT     Status: None   Collection Time: 04/01/19  4:32 AM  Result Value Ref Range   aPTT 34 24 - 36 seconds    Comment: Performed at [facility], [address] [place], [place] [address]  Type and screen [facility]     Status: None   Collection Time: 04/01/19  4:32 AM  Result Value Ref Range   ABO/RH(D) O POS    Antibody Screen NEG    Sample Expiration      04/04/2019,2359 Performed at [facility], [address] [place], [place] [address]   Basic metabolic panel     Status: Abnormal   Collection Time: 04/01/19  4:32 AM  Result Value Ref Range   Sodium 136 135 - 145 mmol/L   Potassium 3.8 3.5 - 5.1 mmol/L   Chloride 104 98 - 111 mmol/L   CO2 25 22 - 32 mmol/L   Glucose, Bld 117 (H) 70 - 99 mg/dL   BUN 12 6 - 20 mg/dL   Creatinine, Ser 2.95 0.61 - 1.24 mg/dL   Calcium 8.4 (L) 8.9 - 10.3 mg/dL   GFR calc  non Af Amer >60 >60 mL/min   GFR calc Af Amer >60 >60 mL/min   Anion gap 7 5 - 15    Comment: Performed at [facility], [address] [place], [place] [address]  CBC     Status: Abnormal   Collection Time: 04/01/19  4:32 AM  Result Value Ref Range   WBC 10.6 (H) 4.0 - 10.5 K/uL   RBC 4.28 4.22 - 5.81 MIL/uL   Hemoglobin 13.0 13.0 - 17.0 g/dL   HCT 86.5 78.4 - 69.6 %   MCV 94.9 80.0 - 100.0 fL   MCH 30.4 26.0 - 34.0 pg   MCHC 32.0 30.0 - 36.0 g/dL   RDW 29.5 28.4 - 13.2 %   Platelets 213 150 - 400 K/uL   nRBC 0.0 0.0 - 0.2 %    Comment: Performed at [facility], [address], [place], [place] [address]  Lactic acid, plasma     Status: None   Collection Time: 04/01/19  4:32 AM  Result Value Ref Range   Lactic Acid, Venous 0.9 0.5 - 1.9 mmol/L    Comment: Performed at [facility], [address] [place],  [place] [address]  Procalcitonin     Status: None   Collection Time: 04/01/19  4:32 AM  Result Value Ref Range   Procalcitonin 0.29 ng/mL    Comment:        Interpretation: PCT (Procalcitonin) <= 0.5 ng/mL: Systemic infection (sepsis) is not likely. Local bacterial infection is possible. (NOTE)       Sepsis PCT Algorithm           Lower Respiratory Tract                                      Infection PCT Algorithm    ----------------------------     ----------------------------         PCT < 0.25 ng/mL                PCT < 0.10 ng/mL         Strongly encourage             Strongly discourage   discontinuation of antibiotics    initiation of antibiotics    ----------------------------     -----------------------------       PCT 0.25 - 0.50 ng/mL            PCT 0.10 - 0.25 ng/mL               OR       >80% decrease in PCT            Discourage initiation of                                            antibiotics      Encourage discontinuation           of antibiotics    ----------------------------      -----------------------------         PCT >= 0.50 ng/mL              PCT 0.26 - 0.50 ng/mL               AND        <80% decrease in PCT             Encourage initiation of                                             antibiotics       Encourage continuation           of antibiotics    ----------------------------     -----------------------------        PCT >= 0.50 ng/mL                  PCT > 0.50 ng/mL               AND         increase in PCT                  Strongly encourage  initiation of antibiotics    Strongly encourage escalation           of antibiotics                                     -----------------------------                                           PCT <= 0.25 ng/mL                                                 OR                                        > 80% decrease in PCT                                     Discontinue / Do not initiate                                             antibiotics Performed at [facility], [address], [place], [place] [address]   ABO/Rh     Status: None (Preliminary result)   Collection Time: 04/01/19  4:32 AM  Result Value Ref Range   ABO/RH(D)      O POS Performed at [facility], [address], [place], [place] [address]   Lactic acid, plasma     Status: None   Collection Time: 04/01/19  6:14 AM  Result Value Ref Range   Lactic Acid, Venous 0.7 0.5 - 1.9 mmol/L    Comment: Performed at [facility], [address], [place], [place] [address]   Ct Abdomen Pelvis W Contrast  Result Date: 04/01/2019 CLINICAL DATA:  Abdominal pain, intermittent left abdominal pain and painful urination, leukocytosis EXAM: CT ABDOMEN AND PELVIS WITH CONTRAST TECHNIQUE: Multidetector CT imaging of the abdomen and pelvis was performed using the standard protocol following bolus administration of intravenous contrast. CONTRAST:  OMNIPAQUE  IOHEXOL 300 MG/ML  SOLN COMPARISON:  Same day abdominal radiograph. FINDINGS: Lower chest: Multifocal patchy areas of subpleural ground-glass opacity are present in the left lower lobe and lingula. More dependent ground-glass is noted posteriorly likely reflecting atelectasis. Normal heart size. No pericardial effusion. Hepatobiliary: No focal liver abnormality is seen. No gallstones, gallbladder wall thickening, or biliary dilatation. Pancreas: Unremarkable. No pancreatic ductal dilatation or surrounding inflammatory changes. Spleen: Normal in size without focal abnormality. Adrenals/Urinary Tract: Adrenal glands are unremarkable. Kidneys are normal, without renal calculi, focal lesion, or hydronephrosis. Mild thickening is noted in the adjacent the distal left ureter likely secondary to the stranding and inflammation in the left lower quadrant. Similarly there is asymmetric left bladder wall thickening. Stomach/Bowel: Distal esophagus, stomach and duodenal sweep are unremarkable. No small bowel wall thickening or dilatation. No evidence of obstruction. A normal appendix is visualized.  Proximal colon is unremarkable. There is segmental thickening of the sigmoid colon centered upon several inflamed colonic diverticula and a hypoattenuating, ill-defined fluid collection extending from the inflamed posterior colonic wall. Extensive adjacent inflammation and phlegmonous changes present as well as reactive free fluid tracking along the paracolic gutter into the deep pelvis. No resulting obstruction. Vascular/Lymphatic: The aorta is normal caliber. Few reactive lymph nodes noted in the low mesentery. No pathologically enlarged nodes in the abdomen or pelvis Reproductive: The prostate and seminal vesicles are unremarkable. Other: Reactive free fluid phlegmonous change in the left lower quadrant. No free intraperitoneal air. Musculoskeletal: No acute osseous abnormality or suspicious osseous lesion. IMPRESSION: 1. Acute  diverticulitis of the sigmoid colon centered upon several inflamed colonic diverticula and a hypoattenuating, ill-defined fluid collection extending from the inflamed posterior colonic wall, concerning for contained perforation or possible early abscess formation. Extensive adjacent inflammation and phlegmonous changes present as well as reactive free fluid tracking along the paracolic gutter into the deep pelvis. No resulting obstruction. 2. Asymmetric left bladder wall thickening and left urothelial thickening, which may be reactive to the adjacent inflammatory process in the left lower quadrant. If urinary symptoms are present however consider evaluation with urinalysis. 3. Multifocal patchy areas of subpleural ground-glass opacity in the left lower lobe and lingula could reflect an acute infectious or inflammatory process. Electronically Signed   By: Kreg Shropshire M.D.   On: 04/01/2019 00:57   . sodium chloride 125 mL/hr at 04/01/19 0254  . piperacillin-tazobactam (ZOSYN)  IV     Anti-infectives (From admission, onward)   Start     Dose/Rate Route Frequency Ordered Stop   04/01/19 0800  piperacillin-tazobactam (ZOSYN) IVPB 3.375 g     3.375 g 12.5 mL/hr over 240 Minutes Intravenous Every 8 hours 04/01/19 0235     04/01/19 0130  piperacillin-tazobactam (ZOSYN) IVPB 3.375 g     3.375 g 100 mL/hr over 30 Minutes Intravenous  Once 04/01/19 0129 04/01/19 0413       Assessment/Plan Hypertension Tobacco use  Sigmoid diverticulitis with possible perforation/abscess  FEN: N.p.o./IV fluids ID: Zosyn 10/1 >> day 1 DVT: SCDs -he can have DVT chemical prophylaxis from our standpoint Follow-up: To be determined  Plan: This is his first episode of diverticulitis.  No family history of diverticulitis.  No history of constipation.  He is tender in the left lower quadrant.  Let him have sips and chips for now.  Agree with IV antibiotics, IV fluid hydration, bowel rest.      [facility] Surgery Pager:  [phone]    04/01/2019 7:10 AM

## 2019-04-01 NOTE — Progress Notes (Signed)
Pharmacy Antibiotic Note  Adrian Hess is a 38 y.o. male admitted on 03/31/2019 with diverticulitis.  Pharmacy has been consulted for zosyn dosing.  Plan: Zosyn 3.375g IV q8h (4 hour infusion).  F/u scr/cultures  Height: 6\' 1"  (185.4 cm) Weight: 250 lb (113.4 kg) IBW/kg (Calculated) : 79.9  Temp (24hrs), Avg:99.9 F (37.7 C), Min:99.9 F (37.7 C), Max:99.9 F (37.7 C)  Recent Labs  Lab 03/31/19 1955  WBC 15.2*  CREATININE 0.97    Estimated Creatinine Clearance: 137.6 mL/min (by C-G formula based on SCr of 0.97 mg/dL).    Allergies  Allergen Reactions  . Percocet [Oxycodone-Acetaminophen] Hypertension    irriatable    Antimicrobials this admission: 10/1 zosyn >>    >>   Dose adjustments this admission:   Microbiology results:  BCx:   UCx:    Sputum:    MRSA PCR:   Thank you for allowing pharmacy to be a part of this patient's care.  Dorrene German 04/01/2019 2:34 AM

## 2019-04-02 LAB — BASIC METABOLIC PANEL
Anion gap: 9 (ref 5–15)
BUN: 9 mg/dL (ref 6–20)
CO2: 25 mmol/L (ref 22–32)
Calcium: 8.6 mg/dL — ABNORMAL LOW (ref 8.9–10.3)
Chloride: 101 mmol/L (ref 98–111)
Creatinine, Ser: 1.04 mg/dL (ref 0.61–1.24)
GFR calc Af Amer: >60 mL/min (ref >60)
GFR calc non Af Amer: >60 mL/min (ref >60)
Glucose, Bld: 98 mg/dL (ref 70–99)
Potassium: 4.2 mmol/L (ref 3.5–5.1)
Sodium: 135 mmol/L (ref 135–145)

## 2019-04-02 LAB — CBC
HCT: 42.1 % (ref 39.0–52.0)
Hemoglobin: 13.6 g/dL (ref 13.0–17.0)
MCH: 30.6 pg (ref 26.0–34.0)
MCHC: 32.3 g/dL (ref 30.0–36.0)
MCV: 94.6 fL (ref 80.0–100.0)
Platelets: 214 10*3/uL (ref 150–400)
RBC: 4.45 MIL/uL (ref 4.22–5.81)
RDW: 12.1 % (ref 11.5–15.5)
WBC: 7.8 10*3/uL (ref 4.0–10.5)
nRBC: 0 % (ref 0.0–0.2)

## 2019-04-02 LAB — GLUCOSE, CAPILLARY: Glucose-Capillary: 87 mg/dL (ref 70–99)

## 2019-04-02 MED ORDER — OXYCODONE HCL 5 MG PO TABS
5.0000 mg | ORAL_TABLET | Freq: Four times a day (QID) | ORAL | Status: DC | PRN
  Administered 2019-04-02 – 2019-04-03 (×4): 5 mg via ORAL
  Filled 2019-04-02 (×4): qty 1

## 2019-04-02 MED ORDER — AMOXICILLIN-POT CLAVULANATE 875-125 MG PO TABS
1.0000 | ORAL_TABLET | Freq: Two times a day (BID) | ORAL | Status: DC
  Administered 2019-04-02 – 2019-04-03 (×3): 1 via ORAL
  Filled 2019-04-02 (×3): qty 1

## 2019-04-02 NOTE — Plan of Care (Signed)

## 2019-04-02 NOTE — Progress Notes (Signed)
PROGRESS NOTE  Adrian Hess JYN:[id number] DOB: 06/29/1981   PCP: Jac Canavanysinger, David S, PA-C  Patient is from: Home  DOA: 03/31/2019 LOS: 1  Brief Narrative / Interim history: 38 year old male with history of HTN and tobacco use presenting with 1 week of abdominal pain, nausea and constipation with associated urinary symptoms and found to have acute sigmoid diverticulitis with finding concerning for early abscess versus contained perforation.  Started on IV Zosyn.  General surgery consulted. Patient was managed medically with IV antibiotics and bowel rest.    Subjective: No major events overnight of this morning.  Abdominal pain significantly improved.  Denies nausea or vomiting.  Reports having bowel movement yesterday.  Denies cardiopulmonary symptoms and UTI symptoms.  Would like to try some food.  Objective: Vitals:   04/01/19 1355 04/01/19 2113 04/02/19 0540 04/02/19 1357  BP: (!) 134/94 (!) 146/96 137/85 (!) 138/99  Pulse: 73 82 70 82  Resp:  18 18   Temp: 98.2 F (36.8 C) 97.6 F (36.4 C) 98.4 F (36.9 C) 98.9 F (37.2 C)  TempSrc: Oral Oral Oral Oral  SpO2: 97% 99% 97% 97%  Weight:   121.4 kg   Height:        Intake/Output Summary (Last 24 hours) at 04/02/2019 1528 Last data filed at 04/02/2019 1409 Gross per 24 hour  Intake 3947.99 ml  Output 200 ml  Net 3747.99 ml   Filed Weights   03/31/19 1939 04/01/19 0315 04/02/19 0540  Weight: 113.4 kg 113 kg 121.4 kg    Examination:  GENERAL: No acute distress.  Appears well.  HEENT: MMM.  Vision and hearing grossly intact.  NECK: Supple.  No apparent JVD.  RESP:  No IWOB. Good air movement bilaterally. CVS:  RRR. Heart sounds normal.  ABD/GI/GU: Bowel sounds present. Soft. Non tender.  MSK/EXT:  Moves extremities. No apparent deformity or edema.  SKIN: no apparent skin lesion or wound NEURO: Awake, alert and oriented appropriately.  No gross deficit.  PSYCH: Calm. Normal affect.   Assessment & Plan: Acute sigmoid  diverticulitis-first episode-improved.  Abdominal pain resolved.  No tenderness to palpation. -CT A/P revealed acute sigmoid diverticulitis with finding concerning for early abscess versus contained perforation. -IV Zosyn 10/1-10/2 -Augmentin 10/2>> -Advance diet to full liquid -Outpatient follow-up with GI for colonoscopy.  Essential hypertension: Normotensive. -Continue home amlodipine. -PRN hydralazine  Tobacco use disorder: -Encourage cessation -Nicotine patch  DVT prophylaxis: SCD Code Status: Full code Family Communication: Patient and/or RN. Available if any question. Disposition Plan: Remains inpatient pending clinical improvement and p.o. tolerance. Consultants: General surgery  Procedures:  None  Microbiology summarized: SARS-CoV-2 screen negative. Blood culture pending.  Antimicrobials: Anti-infectives (From admission, onward)   Start     Dose/Rate Route Frequency Ordered Stop   04/02/19 1430  amoxicillin-clavulanate (AUGMENTIN) 875-125 MG per tablet 1 tablet     1 tablet Oral Every 12 hours 04/02/19 1347     04/01/19 0800  piperacillin-tazobactam (ZOSYN) IVPB 3.375 g  Status:  Discontinued     3.375 g 12.5 mL/hr over 240 Minutes Intravenous Every 8 hours 04/01/19 0235 04/02/19 1347   04/01/19 0130  piperacillin-tazobactam (ZOSYN) IVPB 3.375 g     3.375 g 100 mL/hr over 30 Minutes Intravenous  Once 04/01/19 0129 04/01/19 0413      Sch Meds:  Scheduled Meds: . amLODipine  10 mg Oral Daily  . amoxicillin-clavulanate  1 tablet Oral Q12H  . nicotine  21 mg Transdermal Daily   Continuous Infusions:  PRN  Meds:.acetaminophen **OR** acetaminophen, hydrALAZINE, ondansetron, oxyCODONE   I have personally reviewed the following labs and images: CBC: Recent Labs  Lab 03/31/19 1955 04/01/19 0432 04/02/19 0740  WBC 15.2* 10.6* 7.8  HGB 14.3 13.0 13.6  HCT 44.8 40.6 42.1  MCV 95.3 94.9 94.6  PLT 257 213 214   BMP &GFR Recent Labs  Lab 03/31/19 1955  04/01/19 0432 04/02/19 0740  NA 139 136 135  K 4.0 3.8 4.2  CL 103 104 101  CO2 25 25 25   GLUCOSE 94 117* 98  BUN 15 12 9   CREATININE 0.97 1.05 1.04  CALCIUM 9.5 8.4* 8.6*   Estimated Creatinine Clearance: 132.7 mL/min (by C-G formula based on SCr of 1.04 mg/dL). Liver & Pancreas: Recent Labs  Lab 03/31/19 1955  AST 25  ALT 41  ALKPHOS 83  BILITOT 0.9  PROT 8.2*  ALBUMIN 4.4   Recent Labs  Lab 03/31/19 1955  LIPASE 21   No results for input(s): AMMONIA in the last 168 hours. Diabetic: No results for input(s): HGBA1C in the last 72 hours. Recent Labs  Lab 04/01/19 0732 04/02/19 0736  GLUCAP 86 87   Cardiac Enzymes: No results for input(s): CKTOTAL, CKMB, CKMBINDEX, TROPONINI in the last 168 hours. No results for input(s): PROBNP in the last 8760 hours. Coagulation Profile: Recent Labs  Lab 04/01/19 0432  INR 1.1   Thyroid Function Tests: No results for input(s): TSH, T4TOTAL, FREET4, T3FREE, THYROIDAB in the last 72 hours. Lipid Profile: No results for input(s): CHOL, HDL, LDLCALC, TRIG, CHOLHDL, LDLDIRECT in the last 72 hours. Anemia Panel: No results for input(s): VITAMINB12, FOLATE, FERRITIN, TIBC, IRON, RETICCTPCT in the last 72 hours. Urine analysis:    Component Value Date/Time   COLORURINE YELLOW 03/31/2019 2255   APPEARANCEUR CLEAR 03/31/2019 2255   LABSPEC 1.025 03/31/2019 2255   PHURINE 5.0 03/31/2019 2255   GLUCOSEU NEGATIVE 03/31/2019 2255   HGBUR NEGATIVE 03/31/2019 2255   BILIRUBINUR NEGATIVE 03/31/2019 2255   BILIRUBINUR n 01/19/2013 0847   KETONESUR NEGATIVE 03/31/2019 2255   PROTEINUR 30 (A) 03/31/2019 2255   UROBILINOGEN 0.2 08/24/2013 0108   NITRITE NEGATIVE 03/31/2019 2255   LEUKOCYTESUR NEGATIVE 03/31/2019 2255   Sepsis Labs: Invalid input(s): PROCALCITONIN, LACTICIDVEN  Microbiology: Recent Results (from the past 240 hour(s))  SARS Coronavirus 2 [facility] order, Performed in [facility] hospital lab) Nasopharyngeal  Nasopharyngeal Swab     Status: None   Collection Time: 04/01/19  2:47 AM   Specimen: Nasopharyngeal Swab  Result Value Ref Range Status   SARS Coronavirus 2 NEGATIVE NEGATIVE Final    Comment: (NOTE) If result is NEGATIVE SARS-CoV-2 target nucleic acids are NOT DETECTED. The SARS-CoV-2 RNA is generally detectable in upper and lower  respiratory specimens during the acute phase of infection. The lowest  concentration of SARS-CoV-2 viral copies this assay can detect is 250  copies / mL. A negative result does not preclude SARS-CoV-2 infection  and should not be used as the sole basis for treatment or other  patient management decisions.  A negative result may occur with  improper specimen collection / handling, submission of specimen other  than nasopharyngeal swab, presence of viral mutation(s) within the  areas targeted by this assay, and inadequate number of viral copies  (<250 copies / mL). A negative result must be combined with clinical  observations, patient history, and epidemiological information. If result is POSITIVE SARS-CoV-2 target nucleic acids are DETECTED. The SARS-CoV-2 RNA is generally detectable in upper and lower  respiratory specimens dur ing the acute phase of infection.  Positive  results are indicative of active infection with SARS-CoV-2.  Clinical  correlation with patient history and other diagnostic information is  necessary to determine patient infection status.  Positive results do  not rule out bacterial infection or co-infection with other viruses. If result is PRESUMPTIVE POSTIVE SARS-CoV-2 nucleic acids MAY BE PRESENT.   A presumptive positive result was obtained on the submitted specimen  and confirmed on repeat testing.  While 2019 novel coronavirus  (SARS-CoV-2) nucleic acids may be present in the submitted sample  additional confirmatory testing may be necessary for epidemiological  and / or clinical management purposes  to differentiate between   SARS-CoV-2 and other Sarbecovirus currently known to infect humans.  If clinically indicated additional testing with an alternate test  methodology [phone]) is advised. The SARS-CoV-2 RNA is generally  detectable in upper and lower respiratory sp ecimens during the acute  phase of infection. The expected result is Negative. Fact Sheet for Patients:  StrictlyIdeas.no Fact Sheet for Healthcare Providers: BankingDealers.co.za This test is not yet approved or cleared by the [place] FDA and has been authorized for detection and/or diagnosis of SARS-CoV-2 by FDA under an Emergency Use Authorization (EUA).  This EUA will remain in effect (meaning this test can be used) for the duration of the COVID-19 declaration under Section 564(b)(1) of the Act, 21 U.S.C. section 360bbb-3(b)(1), unless the authorization is terminated or revoked sooner. Performed at [facility], [place] [address], [place], [place] [address]   Culture, blood (Routine X 2) w Reflex to ID Panel     Status: None (Preliminary result)   Collection Time: 04/01/19  2:47 AM   Specimen: BLOOD RIGHT ARM  Result Value Ref Range Status   Specimen Description   Final    BLOOD RIGHT ARM Performed at [place] [address] [place], [place] [address]    Special Requests   Final    BOTTLES DRAWN AEROBIC AND ANAEROBIC Blood Culture adequate volume Performed at [place] [address] [place], [place] [address]    Culture   Final    NO GROWTH 1 DAY Performed at [facility], [place] [address] [place], [place] [address]    Report Status PENDING  Incomplete  Culture, blood (Routine X 2) w Reflex to ID Panel     Status: None (Preliminary result)   Collection Time: 04/01/19  2:47 AM   Specimen: BLOOD  Result Value Ref Range Status   Specimen Description   Final    BLOOD RIGHT ANTECUBITAL Performed at [place] [address] [place], [place] [address]    Special Requests   Final    BOTTLES DRAWN AEROBIC AND ANAEROBIC Blood Culture adequate volume Performed at [place] [address], [place], [place] [address]    Culture   Final    NO GROWTH 1 DAY Performed at [facility], [place] [address], [place], [place] [address]    Report Status PENDING  Incomplete    Radiology Studies: No results found.  Kytzia Gienger T. Bardwell  If 7PM-7AM, please contact night-coverage [email or web address] Password [email or web address] 04/02/2019, 3:28 PM

## 2019-04-02 NOTE — Progress Notes (Signed)
    CC: Abdominal pain  Subjective: He looks great this AM.  He does had some pain medicine but is not having abdominal pain this a.m.  Passing gas and had a bowel movement.  He had about 500 cc clears with ice chips and sips yesterday.  Objective: Vital signs in last 24 hours: Temp:  [97.6 F (36.4 C)-98.4 F (36.9 C)] 98.4 F (36.9 C) (10/02 0540) Pulse Rate:  [70-82] 70 (10/02 0540) Resp:  [18] 18 (10/02 0540) BP: (134-146)/(85-96) 137/85 (10/02 0540) SpO2:  [97 %-99 %] 97 % (10/02 0540) Weight:  [121.4 kg] 121.4 kg (10/02 0540) Last BM Date: 04/01/19 420 PO 3200 IV Urine 325 recorded Stool x 1 Afebrile, VSS BMP stable, WBC 7.8.   Intake/Output from previous day: 10/01 0701 - 10/02 0700 In: 3627.8 [P.O.:420; I.V.:3125; IV Piggyback:82.8] Out: 325 [Urine:325] Intake/Output this shift: No intake/output data recorded.  General appearance: alert, cooperative and no distress Resp: clear to auscultation bilaterally GI: soft, non-tender; bowel sounds normal; no masses,  no organomegaly and no pain this AM  Lab Results:  Recent Labs    04/01/19 0432 04/02/19 0740  WBC 10.6* 7.8  HGB 13.0 13.6  HCT 40.6 42.1  PLT 213 214    BMET Recent Labs    04/01/19 0432 04/02/19 0740  NA 136 135  K 3.8 4.2  CL 104 101  CO2 25 25  GLUCOSE 117* 98  BUN 12 9  CREATININE 1.05 1.04  CALCIUM 8.4* 8.6*   PT/INR Recent Labs    04/01/19 0432  LABPROT 13.7  INR 1.1    Recent Labs  Lab 03/31/19 1955  AST 25  ALT 41  ALKPHOS 83  BILITOT 0.9  PROT 8.2*  ALBUMIN 4.4     Lipase     Component Value Date/Time   LIPASE 21 03/31/2019 1955     Medications: . amLODipine  10 mg Oral Daily  . nicotine  21 mg Transdermal Daily    Assessment/Plan Hypertension Tobacco use  Sigmoid diverticulitis with possible perforation/abscess  FEN: N.p.o./IV fluids ID: Zosyn 10/1 >> day 1 DVT: SCDs -he can have DVT chemical prophylaxis from our standpoint Follow-up: To  be determined   Plan: He is making very rapid progress.  No pain this a.m.  I will advance him to a clear liquid diet.  Hopefully we can advance him to full liquids and a soft diet rather quickly, convert him over to oral antibiotics and then discharge home soon.  He will need follow-up with his PCP and colonoscopy in 6 to 8 weeks.  He was talking to his family and apparently his sister has had diverticulitis and there is an uncle who actually died from diverticulitis.        LOS: 1 day    Gurtej Noyola 04/02/2019 [phone]

## 2019-04-03 LAB — GLUCOSE, CAPILLARY: Glucose-Capillary: 98 mg/dL (ref 70–99)

## 2019-04-03 MED ORDER — AMOXICILLIN-POT CLAVULANATE 875-125 MG PO TABS: 1.0000 | ORAL_TABLET | Freq: Two times a day (BID) | ORAL | 0 refills | Status: DC

## 2019-04-03 MED ORDER — OXYCODONE-ACETAMINOPHEN 5-325 MG PO TABS: 1.0000 | ORAL_TABLET | Freq: Three times a day (TID) | ORAL | 0 refills | Status: AC | PRN

## 2019-04-03 MED ORDER — ACETAMINOPHEN 325 MG PO TABS: 650.0000 mg | ORAL_TABLET | Freq: Four times a day (QID) | ORAL | Status: AC | PRN

## 2019-04-03 MED ORDER — AMLODIPINE BESYLATE 10 MG PO TABS: 10.0000 mg | ORAL_TABLET | Freq: Every day | ORAL | 1 refills | Status: DC

## 2019-04-03 MED ORDER — NICOTINE 21 MG/24HR TD PT24: 21.0000 mg | MEDICATED_PATCH | Freq: Every day | TRANSDERMAL | 0 refills | Status: DC

## 2019-04-03 NOTE — Plan of Care (Signed)

## 2019-04-03 NOTE — Progress Notes (Signed)
Patient d/c to home. Significant other at bedside. RN discussed AVS with pt and S.O. Percocet script given to pt. No questions or concerns about d/c info. IV removed.

## 2019-04-03 NOTE — Progress Notes (Signed)
Progress Note: General Surgery Service   Chief Complaint/Subjective: Feels better, tolerating liquids  Objective: Vital signs in last 24 hours: Temp:  [98.7 F (37.1 C)-99.2 F (37.3 C)] 98.7 F (37.1 C) (10/03 0607) Pulse Rate:  [72-82] 72 (10/03 0607) Resp:  [18] 18 (10/03 0607) BP: (133-138)/(81-99) 133/94 (10/03 0607) SpO2:  [97 %-99 %] 97 % (10/03 0607) FiO2 (%):  [19 %] 19 % (10/02 2106) Last BM Date: 04/02/19  Intake/Output from previous day: 10/02 0701 - 10/03 0700 In: 2724.8 [P.O.:2400; I.V.:222.9; IV Piggyback:101.9] Out: 0  Intake/Output this shift: No intake/output data recorded.  Lungs: nonlabored  Cardiovascular: RRR  Abd: soft, slight tenderness left lower quadrant, no guarding  Extremities: no edema  Neuro: AOx4  Lab Results: CBC  Recent Labs    04/01/19 0432 04/02/19 0740  WBC 10.6* 7.8  HGB 13.0 13.6  HCT 40.6 42.1  PLT 213 214   BMET Recent Labs    04/01/19 0432 04/02/19 0740  NA 136 135  K 3.8 4.2  CL 104 101  CO2 25 25  GLUCOSE 117* 98  BUN 12 9  CREATININE 1.05 1.04  CALCIUM 8.4* 8.6*   PT/INR Recent Labs    04/01/19 0432  LABPROT 13.7  INR 1.1   ABG No results for input(s): PHART, HCO3 in the last 72 hours.  Invalid input(s): PCO2, PO2  Studies/Results:  Anti-infectives: Anti-infectives (From admission, onward)   Start     Dose/Rate Route Frequency Ordered Stop   04/02/19 1430  amoxicillin-clavulanate (AUGMENTIN) 875-125 MG per tablet 1 tablet     1 tablet Oral Every 12 hours 04/02/19 1347     04/01/19 0800  piperacillin-tazobactam (ZOSYN) IVPB 3.375 g  Status:  Discontinued     3.375 g 12.5 mL/hr over 240 Minutes Intravenous Every 8 hours 04/01/19 0235 04/02/19 1347   04/01/19 0130  piperacillin-tazobactam (ZOSYN) IVPB 3.375 g     3.375 g 100 mL/hr over 30 Minutes Intravenous  Once 04/01/19 0129 04/01/19 0413      Medications: Scheduled Meds: . amLODipine  10 mg Oral Daily  . amoxicillin-clavulanate  1  tablet Oral Q12H  . nicotine  21 mg Transdermal Daily   Continuous Infusions: PRN Meds:.acetaminophen **OR** acetaminophen, hydrALAZINE, ondansetron, oxyCODONE  Assessment/Plan: Patient Active Problem List   Diagnosis Date Noted  . Diverticulitis 04/01/2019  . Tobacco abuse 04/01/2019  . Hypertension   . Lumbar pain 11/07/2017   Sigmoid diverticulitis with possible perforation/abscess -advance diet -ok to discharge home today with 10 days antibiotics  FEN: soft diet ID: Zosyn 10/1>>day 2 DVT: SCDs Follow-up: colorectal surgeon in 1 month Marcello Moores, [place] or [place])   LOS: 2 days   Mickeal Skinner, MD Pg# [phone] [facility]

## 2019-04-03 NOTE — Discharge Summary (Signed)
Physician Discharge Summary  Adrian Hess ZOX:[id number] DOB: 08/28/1980 DOA: 03/31/2019  PCP: Jac Canavanysinger, David S, PA-C  Admit date: 03/31/2019 Discharge date: 04/03/2019  Admitted From: Home Disposition: Home  Recommendations for Outpatient Follow-up:  1. Follow up with PCP in 1-2 weeks 2. Follow-up with general surgery in 4 weeks. 3. Recommend GI referral for colonoscopy. 4. Please obtain CBC/BMP/Mag at follow up 5. Please follow up on the following pending results: None  Home Health: None Equipment/Devices: None  Discharge Condition: Stable CODE STATUS: Full code  Hospital Course: 38 year old male with history of HTN and tobacco use presenting with 1 week of abdominal pain, nausea and constipation with associated urinary symptoms and found to have acute sigmoid diverticulitis with finding concerning for early abscess versus contained perforation.  Started on IV Zosyn.  General surgery consulted. Patient was managed medically with IV antibiotics and bowel rest.   Abdominal pain resolved.  Tolerated soft diet.  Transition to Augmentin on discharge on Augmentin for 10 more days to complete treatment course. Patient to follow-up with general surgery in 4 months.  See individual problem list below for more on hospital course.   Discharge Diagnoses:  Acute sigmoid diverticulitis with possible early abscess and contained perforation as noted on CT abdomen and pelvis.  First episode. -IV Zosyn 10/1-10/2.  Transitioned to Augmentin 04/02/2019-04/12/2019 -Abdominal pain resolved.  No tenderness to palpation.  Tolerated soft diet.  Remained stable on Augmentin -Outpatient follow-up with general surgery in 4 weeks -Recommend referral to gastroenterology for colonoscopy  Essential hypertension: Normotensive. -Discharged on home amlodipine.  Tobacco use disorder: -Encouraged cessation -Nicotine patch   Discharge Instructions  Discharge Instructions    Call MD for:  persistant  nausea and vomiting   Complete by: As directed    Call MD for:  severe uncontrolled pain   Complete by: As directed    Call MD for:  temperature >100.4   Complete by: As directed    Diet - low sodium heart healthy   Complete by: As directed    Discharge instructions   Complete by: As directed    It has been a pleasure taking care of you! You were admitted with abdominal pain due to diverticulitis.  You were treated with antibiotics.  With that your symptoms improved.  We are now discharging you on oral antibiotics to complete the treatment course.  You may continue soft diet and slowly advance with pain as a guide.  Please review your new medication list and the directions before you take your medications. Please call and make you hospital follow-up appointments as recommended.  Take care,   Increase activity slowly   Complete by: As directed      Allergies as of 04/03/2019      Reactions   Percocet [oxycodone-acetaminophen] Hypertension   irriatable      Medication List    STOP taking these medications   aspirin EC 325 MG tablet   cyclobenzaprine 10 MG tablet Commonly known as: FLEXERIL   metoCLOPramide 5 MG tablet Commonly known as: Reglan     TAKE these medications   acetaminophen 325 MG tablet Commonly known as: TYLENOL Take 2 tablets (650 mg total) by mouth every 6 (six) hours as needed for mild pain (or Fever >/= 101).   amLODipine 10 MG tablet Commonly known as: NORVASC Take 1 tablet (10 mg total) by mouth daily. Start taking on: April 04, 2019 What changed:   medication strength  how much to take  additional instructions  amoxicillin-clavulanate 875-125 MG tablet Commonly known as: AUGMENTIN Take 1 tablet by mouth every 12 (twelve) hours.   nicotine 21 mg/24hr patch Commonly known as: NICODERM CQ - dosed in mg/24 hours Place 1 patch (21 mg total) onto the skin daily. Start taking on: April 04, 2019   oxyCODONE-acetaminophen 5-325 MG  tablet Commonly known as: PERCOCET/ROXICET Take 2 tablets by mouth every 4 (four) hours as needed for severe pain.      Follow-up Information    [facility] Follow up in 1 month(s).   Specialty: General Surgery Why: with colorectal surgeon Cliffton Asters, Glorious Peach) Contact information: [address] [place] [address] [phone]       Aleen Campi, Kermit Balo, PA-C. Schedule an appointment as soon as possible for a visit in 2 week(s).   Specialty: Family Medicine Contact information: [address] [place] [address] [phone]           Consultations:  General surgery  Procedures/Studies:  2D Echo: None  Ct Abdomen Pelvis W Contrast  Result Date: 04/01/2019 CLINICAL DATA:  Abdominal pain, intermittent left abdominal pain and painful urination, leukocytosis EXAM: CT ABDOMEN AND PELVIS WITH CONTRAST TECHNIQUE: Multidetector CT imaging of the abdomen and pelvis was performed using the standard protocol following bolus administration of intravenous contrast. CONTRAST:  OMNIPAQUE IOHEXOL 300 MG/ML  SOLN COMPARISON:  Same day abdominal radiograph. FINDINGS: Lower chest: Multifocal patchy areas of subpleural ground-glass opacity are present in the left lower lobe and lingula. More dependent ground-glass is noted posteriorly likely reflecting atelectasis. Normal heart size. No pericardial effusion. Hepatobiliary: No focal liver abnormality is seen. No gallstones, gallbladder wall thickening, or biliary dilatation. Pancreas: Unremarkable. No pancreatic ductal dilatation or surrounding inflammatory changes. Spleen: Normal in size without focal abnormality. Adrenals/Urinary Tract: Adrenal glands are unremarkable. Kidneys are normal, without renal calculi, focal lesion, or hydronephrosis. Mild thickening is noted in the adjacent the distal left ureter likely secondary to the stranding and inflammation in the left lower quadrant.  Similarly there is asymmetric left bladder wall thickening. Stomach/Bowel: Distal esophagus, stomach and duodenal sweep are unremarkable. No small bowel wall thickening or dilatation. No evidence of obstruction. A normal appendix is visualized. Proximal colon is unremarkable. There is segmental thickening of the sigmoid colon centered upon several inflamed colonic diverticula and a hypoattenuating, ill-defined fluid collection extending from the inflamed posterior colonic wall. Extensive adjacent inflammation and phlegmonous changes present as well as reactive free fluid tracking along the paracolic gutter into the deep pelvis. No resulting obstruction. Vascular/Lymphatic: The aorta is normal caliber. Few reactive lymph nodes noted in the low mesentery. No pathologically enlarged nodes in the abdomen or pelvis Reproductive: The prostate and seminal vesicles are unremarkable. Other: Reactive free fluid phlegmonous change in the left lower quadrant. No free intraperitoneal air. Musculoskeletal: No acute osseous abnormality or suspicious osseous lesion. IMPRESSION: 1. Acute diverticulitis of the sigmoid colon centered upon several inflamed colonic diverticula and a hypoattenuating, ill-defined fluid collection extending from the inflamed posterior colonic wall, concerning for contained perforation or possible early abscess formation. Extensive adjacent inflammation and phlegmonous changes present as well as reactive free fluid tracking along the paracolic gutter into the deep pelvis. No resulting obstruction. 2. Asymmetric left bladder wall thickening and left urothelial thickening, which may be reactive to the adjacent inflammatory process in the left lower quadrant. If urinary symptoms are present however consider evaluation with urinalysis. 3. Multifocal patchy areas of subpleural ground-glass opacity in the left lower lobe  and lingula could reflect an acute infectious or inflammatory process. Electronically Signed    By: Kreg Shropshire M.D.   On: 04/01/2019 00:57      Subjective: No major events overnight of this morning.  Abdominal pain resolved.  Tolerated soft diet.  Denies nausea or vomiting.  Denies hematochezia or melena.  Feels well and ready to go home.  Discharge Exam: Vitals:   04/02/19 2106 04/03/19 0607  BP: 134/81 (!) 133/94  Pulse: 76 72  Resp: 18 18  Temp: 99.2 F (37.3 C) 98.7 F (37.1 C)  SpO2: 99% 97%    GENERAL: No acute distress.  Appears well.  HEENT: MMM.  Vision and hearing grossly intact.  NECK: Supple.  No JVD.  LUNGS:  No IWOB. Good air movement bilaterally. HEART:  RRR. Heart sounds normal.  ABD: Bowel sounds present. Soft. Non tender.  MSK/EXT:  Moves all extremities. No apparent deformity. No edema bilaterally. SKIN: no apparent skin lesion or wound NEURO: Awake, alert and oriented appropriately.  No gross deficit.  PSYCH: Calm. Normal affect.     The results of significant diagnostics from this hospitalization (including imaging, microbiology, ancillary and laboratory) are listed below for reference.     Microbiology: Recent Results (from the past 240 hour(s))  SARS Coronavirus 2 [facility] order, Performed in [facility] hospital lab) Nasopharyngeal Nasopharyngeal Swab     Status: None   Collection Time: 04/01/19  2:47 AM   Specimen: Nasopharyngeal Swab  Result Value Ref Range Status   SARS Coronavirus 2 NEGATIVE NEGATIVE Final    Comment: (NOTE) If result is NEGATIVE SARS-CoV-2 target nucleic acids are NOT DETECTED. The SARS-CoV-2 RNA is generally detectable in upper and lower  respiratory specimens during the acute phase of infection. The lowest  concentration of SARS-CoV-2 viral copies this assay can detect is 250  copies / mL. A negative result does not preclude SARS-CoV-2 infection  and should not be used as the sole basis for treatment or other  patient management decisions.  A negative result may occur with  improper specimen collection /  handling, submission of specimen other  than nasopharyngeal swab, presence of viral mutation(s) within the  areas targeted by this assay, and inadequate number of viral copies  (<250 copies / mL). A negative result must be combined with clinical  observations, patient history, and epidemiological information. If result is POSITIVE SARS-CoV-2 target nucleic acids are DETECTED. The SARS-CoV-2 RNA is generally detectable in upper and lower  respiratory specimens dur ing the acute phase of infection.  Positive  results are indicative of active infection with SARS-CoV-2.  Clinical  correlation with patient history and other diagnostic information is  necessary to determine patient infection status.  Positive results do  not rule out bacterial infection or co-infection with other viruses. If result is PRESUMPTIVE POSTIVE SARS-CoV-2 nucleic acids MAY BE PRESENT.   A presumptive positive result was obtained on the submitted specimen  and confirmed on repeat testing.  While 2019 novel coronavirus  (SARS-CoV-2) nucleic acids may be present in the submitted sample  additional confirmatory testing may be necessary for epidemiological  and / or clinical management purposes  to differentiate between  SARS-CoV-2 and other Sarbecovirus currently known to infect humans.  If clinically indicated additional testing with an alternate test  methodology [phone]) is advised. The SARS-CoV-2 RNA is generally  detectable in upper and lower respiratory sp ecimens during the acute  phase of infection. The expected result is Negative. Fact Sheet for Patients:  BoilerBrush.com.cy Fact Sheet for Healthcare Providers: https://pope.com/ This test is not yet approved or cleared by the [place] FDA and has been authorized for detection and/or diagnosis of SARS-CoV-2 by FDA under an Emergency Use Authorization (EUA).  This EUA will remain in effect (meaning this  test can be used) for the duration of the COVID-19 declaration under Section 564(b)(1) of the Act, 21 U.S.C. section 360bbb-3(b)(1), unless the authorization is terminated or revoked sooner. Performed at [facility], [address] [place], [place] [address]   Culture, blood (Routine X 2) w Reflex to ID Panel     Status: None (Preliminary result)   Collection Time: 04/01/19  2:47 AM   Specimen: BLOOD RIGHT ARM  Result Value Ref Range Status   Specimen Description   Final    BLOOD RIGHT ARM Performed at [facility], [address], [place], [place] [address]    Special Requests   Final    BOTTLES DRAWN AEROBIC AND ANAEROBIC Blood Culture adequate volume Performed at [facility], [address], [place], [place] [address]    Culture   Final    NO GROWTH 1 DAY Performed at [facility], [address], [place], [place] [address]    Report Status PENDING  Incomplete  Culture, blood (Routine X 2) w Reflex to ID Panel     Status: None (Preliminary result)   Collection Time: 04/01/19  2:47 AM   Specimen: BLOOD  Result Value Ref Range Status   Specimen Description   Final    BLOOD RIGHT ANTECUBITAL Performed at [facility], [address], [place], [place] [address]    Special Requests   Final    BOTTLES DRAWN AEROBIC AND ANAEROBIC Blood Culture adequate volume Performed at [facility], [address] [place], [place] [address]    Culture   Final    NO GROWTH 1 DAY Performed at [facility], [address], [place], [place] [address]    Report Status PENDING  Incomplete     Labs: BNP (last 3 results) No results for input(s): BNP in the last 8760 hours. Basic Metabolic Panel: Recent Labs  Lab 03/31/19 1955 04/01/19 0432 04/02/19 0740  NA 139 136 135  K 4.0 3.8 4.2  CL 103 104 101  CO2 25 25 25   GLUCOSE 94 117* 98  BUN 15 12 9   CREATININE 0.97 1.05 1.04    CALCIUM 9.5 8.4* 8.6*   Liver Function Tests: Recent Labs  Lab 03/31/19 1955  AST 25  ALT 41  ALKPHOS 83  BILITOT 0.9  PROT 8.2*  ALBUMIN 4.4   Recent Labs  Lab 03/31/19 1955  LIPASE 21   No results for input(s): AMMONIA in the last 168 hours. CBC: Recent Labs  Lab 03/31/19 1955 04/01/19 0432 04/02/19 0740  WBC 15.2* 10.6* 7.8  HGB 14.3 13.0 13.6  HCT 44.8 40.6 42.1  MCV 95.3 94.9 94.6  PLT 257 213 214   Cardiac Enzymes: No results for input(s): CKTOTAL, CKMB, CKMBINDEX, TROPONINI in the last 168 hours. BNP: Invalid input(s): POCBNP CBG: Recent Labs  Lab 04/01/19 0732 04/02/19 0736 04/03/19 0743  GLUCAP 86 87 98   D-Dimer No results for input(s): DDIMER in the last 72 hours. Hgb A1c No results for input(s): HGBA1C in the last 72 hours. Lipid Profile No results for input(s): CHOL, HDL, LDLCALC, TRIG, CHOLHDL, LDLDIRECT in the last 72 hours. Thyroid function studies No  results for input(s): TSH, T4TOTAL, T3FREE, THYROIDAB in the last 72 hours.  Invalid input(s): FREET3 Anemia work up No results for input(s): VITAMINB12, FOLATE, FERRITIN, TIBC, IRON, RETICCTPCT in the last 72 hours. Urinalysis    Component Value Date/Time   COLORURINE YELLOW 03/31/2019 2255   APPEARANCEUR CLEAR 03/31/2019 2255   LABSPEC 1.025 03/31/2019 2255   PHURINE 5.0 03/31/2019 2255   GLUCOSEU NEGATIVE 03/31/2019 2255   HGBUR NEGATIVE 03/31/2019 [place] 03/31/2019 2255   BILIRUBINUR n 01/19/2013 [place] 03/31/2019 2255   PROTEINUR 30 (A) 03/31/2019 2255   UROBILINOGEN 0.2 08/24/2013 0108   NITRITE NEGATIVE 03/31/2019 2255   LEUKOCYTESUR NEGATIVE 03/31/2019 2255   Sepsis Labs Invalid input(s): PROCALCITONIN,  WBC,  LACTICIDVEN   Time coordinating discharge: 35 minutes  SIGNED:  Mercy Riding, MD  [facility] 04/03/2019, 10:37 AM  If 7PM-7AM, please contact night-coverage [email or web address] Password [email or web address]

## 2019-04-05 ENCOUNTER — Telehealth: Payer: Self-pay | Admitting: Medical

## 2019-04-05 NOTE — Telephone Encounter (Signed)
I called pt and he now has medicaid. Just wanted to see if you wanted me to schedule him still?

## 2019-04-05 NOTE — Telephone Encounter (Signed)
i'll talk to you about this case

## 2019-04-06 LAB — CULTURE, BLOOD (ROUTINE X 2)
Culture: NO GROWTH
Culture: NO GROWTH
Special Requests: ADEQUATE
Special Requests: ADEQUATE

## 2019-04-09 NOTE — Telephone Encounter (Signed)
Pts wife called and wanted to scheduled a hospital follow up for pt. You never got back with me on what to do since pt now has medicaid. Let me know if you want me to go ahead and schedule

## 2019-04-13 ENCOUNTER — Encounter: Payer: Self-pay | Admitting: Medical

## 2019-04-13 ENCOUNTER — Ambulatory Visit (INDEPENDENT_AMBULATORY_CARE_PROVIDER_SITE_OTHER): Payer: Medicaid Other | Admitting: Medical

## 2019-04-13 VITALS — BP 136/84 | HR 98 | Temp 98.0°F | Ht 73.0 in | Wt 254.0 lb

## 2019-04-13 DIAGNOSIS — Z720 Tobacco use: Secondary | ICD-10-CM

## 2019-04-13 DIAGNOSIS — Z2821 Immunization not carried out because of patient refusal: Secondary | ICD-10-CM

## 2019-04-13 DIAGNOSIS — I10 Essential (primary) hypertension: Secondary | ICD-10-CM | POA: Diagnosis not present

## 2019-04-13 DIAGNOSIS — K5792 Diverticulitis of intestine, part unspecified, without perforation or abscess without bleeding: Secondary | ICD-10-CM

## 2019-04-13 NOTE — Progress Notes (Signed)
Subjective: Chief Complaint  Patient presents with  . Diverticulitis   Here for hospital follow up.  Last visit here 2014.  He has been to a couple other primary care offices in the meantime.  Was hospitalized 03/31/19- 04/03/2019 for diverticulitis with abscess.   Since discharge from hospital feeling better, trying to eat healthier.   Since gyms closed with covid, just hasn't been exercising and wasn't eating as healthy.  He thinks this contributes to diverticulitis.  This was his first episode of diverticulitis.  He presented to ED with abdominal pain, nausea, felt constipation.   Was found to have diverticulitis with abscess, treated medically with antibiotics, no surgical intervention was done.  He does have an appointment for gastro in 2 weeks.  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  First started medication for hypertension in last 3-4 years.   He does smoke.  He is active, owns a [employer].  No other aggravating or relieving factors. No other complaint.   Past Medical History:  Diagnosis Date  . Hypertension   . Insomnia   . Wears contact lenses    Current Outpatient Medications on File Prior to Visit  Medication Sig Dispense Refill  . acetaminophen (TYLENOL) 325 MG tablet Take 2 tablets (650 mg total) by mouth every 6 (six) hours as needed for mild pain (or Fever >/= 101).    Marland Kitchen amLODipine (NORVASC) 10 MG tablet Take 1 tablet (10 mg total) by mouth daily. 90 tablet 1  . amoxicillin-clavulanate (AUGMENTIN) 875-125 MG tablet Take 1 tablet by mouth every 12 (twelve) hours. 20 tablet 0  . oxyCODONE-acetaminophen (PERCOCET) 5-325 MG tablet Take 1 tablet by mouth every 8 (eight) hours as needed for severe pain. 10 tablet 0  . nicotine (NICODERM CQ - DOSED IN MG/24 HOURS) 21 mg/24hr patch Place 1 patch (21 mg total) onto the skin daily. (Patient not taking: Reported on 04/13/2019) 28 patch 0  .  oxyCODONE-acetaminophen (PERCOCET/ROXICET) 5-325 MG tablet Take 2 tablets by mouth every 4 (four) hours as needed for severe pain. (Patient not taking: Reported on 03/20/2017) 16 tablet 0   No current facility-administered medications on file prior to visit.    ROS as in subjective    Objective: BP 136/84   Pulse 98   Temp 98 F (36.7 C)   Ht 6\' 1"  (1.854 m)   Wt 254 lb (115.2 kg)   SpO2 96%   BMI 33.51 kg/m    General appearance: alert, no distress, WD/WN,  Heart: RRR, normal S1, S2, no murmurs Lungs: CTA bilaterally, no wheezes, rhonchi, or rales Abdomen: +bs, soft, mild LLQ tenderness, otherwise non tender, non distended, no masses, no hepatomegaly, no splenomegaly Pulses: 2+ symmetric, upper and lower extremities, normal cap refill      Assessment: Encounter Diagnoses  Name Primary?  . Diverticulitis Yes  . Tobacco use   . Essential hypertension, benign   . Influenza vaccination declined      Plan: Diverticulitis-much improved, finish out antibiotic, follow-up with gastroenterology in 2 weeks as scheduled.  We discussed his scan and first episode of diverticulitis with abscess.  Gave some general counseling on certain trigger food avoidance, discussed short-term brat diet as he finishes healing from the diverticulitis flareup.  If worse in the meantime call back  Declines flu shot today.   Hypertension-continue current medication, counseled on tobacco and advised cessation  Advise see return for a physical  and fasting labs in a month or 2.  Advise he stick with one primary care always not jump around which can lead to inconsistencies in care  [place] was seen today for diverticulitis.  Diagnoses and all orders for this visit:  Diverticulitis -     CBC -     Basic metabolic panel -     Magnesium  Tobacco use  Essential hypertension, benign -     CBC -     Basic metabolic panel -     Magnesium  Influenza vaccination declined

## 2019-04-14 LAB — CBC
Hematocrit: 43.3 % (ref 37.5–51.0)
Hemoglobin: 14.7 g/dL (ref 13.0–17.7)
MCH: 30.4 pg (ref 26.6–33.0)
MCHC: 33.9 g/dL (ref 31.5–35.7)
MCV: 90 fL (ref 79–97)
Platelets: 229 10*3/uL (ref 150–450)
RBC: 4.84 x10E6/uL (ref 4.14–5.80)
RDW: 12.8 % (ref 11.6–15.4)
WBC: 7.6 10*3/uL (ref 3.4–10.8)

## 2019-04-14 LAB — BASIC METABOLIC PANEL
BUN/Creatinine Ratio: 15 (ref 9–20)
BUN: 13 mg/dL (ref 6–20)
CO2: 22 mmol/L (ref 20–29)
Calcium: 9.7 mg/dL (ref 8.7–10.2)
Chloride: 101 mmol/L (ref 96–106)
Creatinine, Ser: 0.88 mg/dL (ref 0.76–1.27)
GFR calc Af Amer: 127 mL/min/{1.73_m2} (ref >59)
GFR calc non Af Amer: 110 mL/min/{1.73_m2} (ref >59)
Glucose: 87 mg/dL (ref 65–99)
Potassium: 4.4 mmol/L (ref 3.5–5.2)
Sodium: 137 mmol/L (ref 134–144)

## 2019-04-14 LAB — MAGNESIUM: Magnesium: 2.2 mg/dL (ref 1.6–2.3)

## 2020-02-01 ENCOUNTER — Emergency Department (HOSPITAL_COMMUNITY)
Admission: EM | Admit: 2020-02-01 | Discharge: 2020-02-01 | Disposition: A | Discharge status: Home / Self Care | Payer: Medicaid Other | Attending: Emergency Medicine | Admitting: Emergency Medicine

## 2020-02-01 ENCOUNTER — Other Ambulatory Visit: Payer: Self-pay

## 2020-02-01 ENCOUNTER — Encounter (HOSPITAL_COMMUNITY): Payer: Self-pay | Admitting: Emergency Medicine

## 2020-02-01 DIAGNOSIS — Z79899 Other long term (current) drug therapy: Secondary | ICD-10-CM | POA: Insufficient documentation

## 2020-02-01 DIAGNOSIS — F17290 Nicotine dependence, other tobacco product, uncomplicated: Secondary | ICD-10-CM | POA: Diagnosis not present

## 2020-02-01 DIAGNOSIS — F17210 Nicotine dependence, cigarettes, uncomplicated: Secondary | ICD-10-CM | POA: Diagnosis not present

## 2020-02-01 DIAGNOSIS — R112 Nausea with vomiting, unspecified: Secondary | ICD-10-CM | POA: Diagnosis present

## 2020-02-01 DIAGNOSIS — I10 Essential (primary) hypertension: Secondary | ICD-10-CM | POA: Diagnosis not present

## 2020-02-01 MED ORDER — ONDANSETRON 4 MG PO TBDP
4.0000 mg | ORAL_TABLET | Freq: Once | ORAL | Status: DC | PRN
  Filled 2020-02-01: qty 1

## 2020-02-01 MED ORDER — ONDANSETRON 4 MG PO TBDP: 4.0000 mg | ORAL_TABLET | Freq: Three times a day (TID) | ORAL | 0 refills | Status: DC | PRN

## 2020-02-01 NOTE — Discharge Instructions (Signed)
Please read instructions below. °Drink clear liquids until your stomach feels better. Then, slowly introduce bland foods into your diet as tolerated, such as bread, rice, apples, bananas. °You can take zofran every 8 hours as needed for nausea. °Follow up with your primary care if symptoms persist. °Return to the ER for severe abdominal pain, fever, uncontrollable vomiting, or new or concerning symptoms. ° °

## 2020-02-01 NOTE — ED Provider Notes (Signed)
[facility]-EMERGENCY DEPT Provider Note   CSN: [id number] Arrival date & time: 02/01/20  1147     History Chief Complaint  Patient presents with  . Nausea  . Emesis    Adrian Hess is a 39 y.o. male past medical history of hypertension, here in the ED with complaint of nausea and vomiting. Patient was here for his son, brought in for sore throat however as he was waiting to be seen he began developing nausea with vomiting. He was given Zofran in triage and states he feels much better. He states this may have been due to his alcohol intake last night, he states he also had a large variety of foods. He recently traveled to the beach last week. He denies any known Covid exposures, however states he has had the COVID-19 illness twice already. Denies associated abdominal pain, cough, fever.  The history is provided by the patient.       Past Medical History:  Diagnosis Date  . Hypertension   . Insomnia   . Wears contact lenses     Patient Active Problem List   Diagnosis Date Noted  . Influenza vaccination declined 04/13/2019  . Essential hypertension, benign 04/13/2019  . Diverticulitis 04/01/2019  . Tobacco use 04/01/2019  . Hypertension   . Lumbar pain 11/07/2017    Past Surgical History:  Procedure Laterality Date  . KNEE SURGERY     right meniscus  . MANDIBLE FRACTURE SURGERY         Family History  Problem Relation Age of Onset  . Hypertension Mother   . Diabetes Mother   . Anxiety disorder Mother   . Depression Mother   . Diabetes Father   . Diabetes Sister   . Anxiety disorder Sister   . Cancer Maternal Grandmother   . Heart disease Neg Hx     Social History   Tobacco Use  . Smoking status: Current Some Day Smoker    Types: Cigars, Cigarettes  . Smokeless tobacco: Never Used  Substance Use Topics  . Alcohol use: Yes    Alcohol/week: 5.0 standard drinks    Types: 5 Cans of beer per week  . Drug use: Yes    Types: Marijuana      Home Medications Prior to Admission medications   Medication Sig Start Date End Date Taking? Authorizing Provider  acetaminophen (TYLENOL) 325 MG tablet Take 2 tablets (650 mg total) by mouth every 6 (six) hours as needed for mild pain (or Fever >/= 101). 04/03/19   Almon Hercules, MD  amLODipine (NORVASC) 10 MG tablet Take 1 tablet (10 mg total) by mouth daily. 04/04/19   Almon Hercules, MD  amoxicillin-clavulanate (AUGMENTIN) 875-125 MG tablet Take 1 tablet by mouth every 12 (twelve) hours. 04/03/19   Almon Hercules, MD  nicotine (NICODERM CQ - DOSED IN MG/24 HOURS) 21 mg/24hr patch Place 1 patch (21 mg total) onto the skin daily. Patient not taking: Reported on 04/13/2019 04/04/19   Almon Hercules, MD  ondansetron (ZOFRAN ODT) 4 MG disintegrating tablet Take 1 tablet (4 mg total) by mouth every 8 (eight) hours as needed for nausea or vomiting. 02/01/20   Terril Chestnut, [place] N, PA-C  oxyCODONE-acetaminophen (PERCOCET) 5-325 MG tablet Take 1 tablet by mouth every 8 (eight) hours as needed for severe pain. 04/03/19 04/02/20  Almon Hercules, MD  oxyCODONE-acetaminophen (PERCOCET/ROXICET) 5-325 MG tablet Take 2 tablets by mouth every 4 (four) hours as needed for severe pain. Patient not  taking: Reported on 03/20/2017 11/22/15   Jerre Simon, PA    Allergies    Percocet [oxycodone-acetaminophen]  Review of Systems   Review of Systems  All other systems reviewed and are negative.   Physical Exam Updated Vital Signs BP (!) 179/119 (BP Location: Left Arm)   Pulse 95   Temp 97.9 F (36.6 C) (Oral)   Resp 17   SpO2 98%   Physical Exam Vitals and nursing note reviewed.  Constitutional:      General: He is not in acute distress.    Appearance: He is well-developed. He is not ill-appearing.  HENT:     Head: Normocephalic and atraumatic.     Mouth/Throat:     Mouth: Mucous membranes are moist.     Pharynx: Oropharynx is clear.  Eyes:     Conjunctiva/sclera: Conjunctivae normal.   Cardiovascular:     Rate and Rhythm: Normal rate and regular rhythm.  Pulmonary:     Effort: Pulmonary effort is normal. No respiratory distress.     Breath sounds: Normal breath sounds.  Abdominal:     General: Bowel sounds are normal.     Palpations: Abdomen is soft.     Tenderness: There is no abdominal tenderness. There is no guarding or rebound.  Skin:    General: Skin is warm.  Neurological:     Mental Status: He is alert.  Psychiatric:        Behavior: Behavior normal.     ED Results / Procedures / Treatments   Labs (all labs ordered are listed, but only abnormal results are displayed) Labs Reviewed - No data to display  EKG None  Radiology No results found.  Procedures Procedures (including critical care time)  Medications Ordered in ED Medications  ondansetron (ZOFRAN-ODT) disintegrating tablet 4 mg (has no administration in time range)    ED Course  I have reviewed the triage vital signs and the nursing notes.  Pertinent labs & imaging results that were available during my care of the patient were reviewed by me and considered in my medical decision making (see chart for details).    MDM Rules/Calculators/A&P                          Patient in the ED with nausea and vomiting after checking his son in for sore throat. He was given Zofran in triage and states he feels much better. He attributes this to his alcohol and a various food intake last night. He has no associated abdominal pain, fevers or chills. Low suspicion for COVID-19 illness as he has had infection twice already. His abdomen is benign, he is well-appearing and in no distress. Will discharge with symptomatic management and outpatient follow-up as needed.  Final Clinical Impression(s) / ED Diagnoses Final diagnoses:  Non-intractable vomiting with nausea, unspecified vomiting type    Rx / DC Orders ED Discharge Orders         Ordered    ondansetron (ZOFRAN ODT) 4 MG disintegrating tablet   Every 8 hours PRN     Discontinue  Reprint     02/01/20 1407           Wyndham Santilli, [place] N, [place] 02/01/20 1411    Pollyann Savoy, MD 02/01/20 1434

## 2020-02-01 NOTE — ED Triage Notes (Signed)
While in triage with son, patient started not feeling well and began vomitting

## 2020-02-02 ENCOUNTER — Telehealth: Payer: Self-pay | Admitting: *Deleted

## 2020-02-02 ENCOUNTER — Telehealth: Payer: Self-pay

## 2020-02-02 NOTE — Telephone Encounter (Signed)
Contacted pt to complete transition of care assessment:  Transition Care Management Follow-up Telephone Call   [facility] Managed Care Transition Call Status:MM [facility] Call Made   Date of discharge and from where: [facility], 02/01/20    How have you been since you were released from the hospital? "much better"  Any questions or concerns? No  Items Reviewed:  Did the pt receive and understand the discharge instructions provided? Yes   Medications obtained and verified? Yes   Any new allergies since your discharge? No   Dietary orders reviewed? Yes  Do you have support at home?  Yes, family  Functional Questionnaire: (I = Independent and D = Dependent)  ADLs: Independent Bathing/Dressing:Independent Meal Prep: Independent Eating: Independent Maintaining continence: Independent Transferring/Ambulation: Independent Managing Meds: Independent Follow up appointments reviewed:  PCP Hospital f/u appt confirmed? No  pt state she will contact [facility] on 02/03/20 to schedule follow up appt   Specialist Hospital f/u appt confirmed? N/a  Are transportation arrangements needed? No   If their condition worsens, is the pt aware to call PCP or go to the EmergencyDept.? Yes  Was the patient provided with contact information for the PCP's office or ED? yes  Was to pt encouraged to call back with questions or concerns?yes  Burnard Bunting, RN, BSN, CCRN Patient Engagement Center [phone]

## 2020-02-02 NOTE — Telephone Encounter (Signed)
Medicaid Managed Care team Transition of Care Assessment outreach attempt #1 made today. Unable to reach patient. HIPPA compliant voice message left requesting a return call. The patient has also been enrolled in an automated discharge follow up call series and will receive two outreach attempts for transition of care assessment. Contact information has been left for the patient and the Medicaid Managed Care team is available to provide assistance to the patient at any time.  ° °Katrice Malgorzata Albert, RN, BSN, CCRN °Patient Engagement Center °[phone] ° °

## 2020-02-02 NOTE — Telephone Encounter (Signed)
Nurse from the hospital called wanting [place] to contact the pt. To get him scheduled for a hospital f/u after his visit to the ED yesterday for nausea and vomiting. I called the pt. Had to [facility] for him to call back to get scheduled.

## 2023-11-20 ENCOUNTER — Ambulatory Visit: Admitting: Podiatry

## 2024-03-26 ENCOUNTER — Telehealth: Payer: Self-pay

## 2024-03-26 NOTE — Telephone Encounter (Signed)
 Copied from CRM [id number]. Topic: Appointments - Transfer of Care >> Mar 26, 2024  3:15 PM Roselie BROCKS wrote: Pt is requesting to transfer FROM: DR Bulah Pt is requesting to transfer TO: Dr Reyne Bustle Reason for requested transfer: been over 3 years  It is the responsibility of the team the patient would like to transfer to (Dr. DR Reyne Bustle) to reach out to the patient if for any reason this transfer is not acceptable.

## 2024-05-06 ENCOUNTER — Ambulatory Visit: Payer: Self-pay | Admitting: Family Medicine

## 2024-05-06 ENCOUNTER — Encounter: Payer: Self-pay | Admitting: Family Medicine

## 2024-05-06 VITALS — BP 150/98 | HR 91 | Ht 72.0 in | Wt 259.6 lb

## 2024-05-06 DIAGNOSIS — E669 Obesity, unspecified: Secondary | ICD-10-CM

## 2024-05-06 DIAGNOSIS — I10 Essential (primary) hypertension: Secondary | ICD-10-CM | POA: Diagnosis not present

## 2024-05-06 DIAGNOSIS — L03031 Cellulitis of right toe: Secondary | ICD-10-CM

## 2024-05-06 DIAGNOSIS — B353 Tinea pedis: Secondary | ICD-10-CM

## 2024-05-06 LAB — POCT GLYCOSYLATED HEMOGLOBIN (HGB A1C): Hemoglobin A1C: 5.8 % — AB (ref 4.0–5.6)

## 2024-05-06 MED ORDER — LISINOPRIL 20 MG PO TABS: 20.0000 mg | ORAL_TABLET | Freq: Every day | ORAL | 3 refills | Status: AC

## 2024-05-06 MED ORDER — CEPHALEXIN 500 MG PO CAPS: 500.0000 mg | ORAL_CAPSULE | Freq: Two times a day (BID) | ORAL | 0 refills | Status: DC

## 2024-05-06 NOTE — Patient Instructions (Addendum)
 F/u 4 weeks for annual physical  Please let us  know when you finish your antibiotic, therefore we can start the anti fungal medication.

## 2024-05-06 NOTE — Progress Notes (Signed)
 Name: Adrian Hess   Date of Visit: 05/06/24   Date of last visit with me: Visit date not found   CHIEF COMPLAINT:  Chief Complaint  Patient presents with   Establish Care    Wants a physical. Wants a referral to a podiatrist, hit foot with mowing grass not been the same sense. Has a spot on calf that swells up, feels like fluid inside. Can't focus at work, getting fatigue.        HPI:  Discussed the use of AI scribe software for clinical note transcription with the patient, who gave verbal consent to proceed.  History of Present Illness Adrian Hess is a 43 year old male who presents with a foot injury and high blood pressure.  He sustained a foot injury approximately one month ago while cutting grass, when he was struck by a tree limb. Since then, he has experienced increasing redness, tenderness, and aching in the affected area, particularly when on his feet for extended periods. He describes the sensation as 'hard' and notes the development of fluid-like swelling. He has been using an antifungal spray twice daily, which has helped with color changes, but he has not seen complete resolution.  He has a history of high blood pressure and was previously prescribed amlodipine , which he discontinued due to adverse effects, including yellowing of the eyes and feeling unwell. He does not recall experiencing leg swelling while on the medication. He is concerned about his blood pressure as he has likely had elevated readings for several years.  He consumes alcohol regularly, drinking three to four beers three times a week, and more on weekends.  He has a family history of diabetes, with his sister being affected. He is concerned about his blood sugar levels, as he has been informed that foot issues can be related to high sugar levels. He reports a recent blood sugar level of 5.7, which he understands is approaching the prediabetic range.  He has a long-standing issue with leg swelling,  which he describes as a 'muscle' that becomes hard, particularly after physical activity. He has had this condition since he was young and associates it with his active lifestyle, including playing soccer. He has not been able to play soccer since June due to his foot issues.  He reports a history of a benign growth, possibly a lipoma, on his leg, which he has had for many years. He mentions a friend who had a similar condition surgically removed.     OBJECTIVE:       05/06/2024   10:39 AM  Depression screen PHQ 2/9  Decreased Interest 0  Down, Depressed, Hopeless 0  PHQ - 2 Score 0     BP Readings from Last 3 Encounters:  05/06/24 (!) 150/98  02/01/20 (!) 179/119  04/13/19 136/84    BP (!) 150/98   Pulse 91   Ht 6' (1.829 m)   Wt 259 lb 9.6 oz (117.8 kg)   SpO2 98%   BMI 35.21 kg/m    Physical Exam EXTREMITIES: Tenderness to palpation of the foot. Breakdown in the webbing of the toes likely fungal. Note ertyhema of the dorsal lateral foot near the 4th digit. TTP over the area. Raised erythematous base.   Physical Exam  ASSESSMENT/PLAN:   Assessment & Plan Cellulitis of toe of right foot  Obesity (BMI 30-39.9)  Primary hypertension  Tinea pedis of right foot    Assessment and Plan Assessment & Plan Cellulitis of right toe  Cellulitis likely due to trauma, with signs of infection. - Prescribed keflex for 7 days, one pill twice a day.  Tinea pedis and onychomycosis of right foot/toe Tinea pedis and onychomycosis with partial response to antifungal spray. Oral antifungal may be needed pending liver function tests due to alcohol use. - Continue antifungal spray twice daily. - Ordered liver function tests. - Consider oral antifungal in 1 week after finishing antibiotics -  Essential hypertension Essential hypertension with previous amlodipine  reaction. Lisinopril chosen for renal protection and blood pressure control. - Prescribed lisinopril, one pill daily. -  Advised home blood pressure monitoring and recording. - Scheduled follow-up in 4 weeks.  Borderline elevated hemoglobin A1c (prediabetes risk) Borderline elevated hemoglobin A1c at 5.7%, indicating prediabetes risk. - Advised dietary modifications, reducing meal frequency and portion sizes. - Encouraged fasting during the day and larger meal at night.  Benign lipoma/cyst of left lower leg Benign lipoma or cyst, likely non-urgent. - Monitor for changes in size or symptoms. - Consider ultrasound if symptoms worsen.     Adrian Hess A. Vita MD [facility]

## 2024-05-07 ENCOUNTER — Ambulatory Visit: Payer: Self-pay | Admitting: Family Medicine

## 2024-05-07 ENCOUNTER — Ambulatory Visit: Payer: Self-pay

## 2024-05-07 LAB — CBC WITH DIFFERENTIAL/PLATELET
Basophils Absolute: 0 x10E3/uL (ref 0.0–0.2)
Basos: 0 %
EOS (ABSOLUTE): 0.2 x10E3/uL (ref 0.0–0.4)
Eos: 2 %
Hematocrit: 46.9 % (ref 37.5–51.0)
Hemoglobin: 15.7 g/dL (ref 13.0–17.7)
Immature Grans (Abs): 0.1 x10E3/uL (ref 0.0–0.1)
Immature Granulocytes: 1 %
Lymphocytes Absolute: 2.5 x10E3/uL (ref 0.7–3.1)
Lymphs: 27 %
MCH: 31.3 pg (ref 26.6–33.0)
MCHC: 33.5 g/dL (ref 31.5–35.7)
MCV: 93 fL (ref 79–97)
Monocytes Absolute: 0.8 x10E3/uL (ref 0.1–0.9)
Monocytes: 8 %
Neutrophils Absolute: 5.8 x10E3/uL (ref 1.4–7.0)
Neutrophils: 62 %
Platelets: 185 x10E3/uL (ref 150–450)
RBC: 5.02 x10E6/uL (ref 4.14–5.80)
RDW: 13.1 % (ref 11.6–15.4)
WBC: 9.4 x10E3/uL (ref 3.4–10.8)

## 2024-05-07 LAB — COMPREHENSIVE METABOLIC PANEL WITH GFR
ALT: 48 IU/L — ABNORMAL HIGH (ref 0–44)
AST: 38 IU/L (ref 0–40)
Albumin: 4.6 g/dL (ref 4.1–5.1)
Alkaline Phosphatase: 91 IU/L (ref 47–123)
BUN/Creatinine Ratio: 14 (ref 9–20)
BUN: 15 mg/dL (ref 6–24)
Bilirubin Total: 0.6 mg/dL (ref 0.0–1.2)
CO2: 20 mmol/L (ref 20–29)
Calcium: 9.5 mg/dL (ref 8.7–10.2)
Chloride: 101 mmol/L (ref 96–106)
Creatinine, Ser: 1.05 mg/dL (ref 0.76–1.27)
Globulin, Total: 2.7 g/dL (ref 1.5–4.5)
Glucose: 85 mg/dL (ref 70–99)
Potassium: 4.5 mmol/L (ref 3.5–5.2)
Sodium: 137 mmol/L (ref 134–144)
Total Protein: 7.3 g/dL (ref 6.0–8.5)
eGFR: 91 mL/min/1.73 (ref >59)

## 2024-05-07 NOTE — Telephone Encounter (Signed)
 FYI Only or Action Required?: FYI only for provider: fyi only.  Patient was last seen in primary care on 05/06/2024 by [name], MD.  Called Nurse Triage reporting Advice Only.  Symptoms began n/a.  Interventions attempted: Other: n/a.  Symptoms are: n/a.  Triage Disposition: Information or Advice Only Call  Patient/caregiver understands and will follow disposition?: Yes     Copied from CRM #[id number]. Topic: Clinical - Lab/Test Results >> May 07, 2024  2:16 PM Gustabo D wrote: Go over labs Reason for Disposition  General information question, no triage required and triager able to answer question  Answer Assessment - Initial Assessment Questions 1. REASON FOR CALL: What is the main reason for your call? or How can I best help you?     Pt just wanted to discuss lab results 2. SYMPTOMS : Do you have any symptoms?      States he started and antibiotic yesterday and was a little itchy. No rash and no itchiness sense. He asked if he could call back with any questions. RN advised to call back if the itchiness returns or any other symptoms, concerns, questions.  Protocols used: Information Only Call - No Triage-A-AH

## 2024-05-07 NOTE — Telephone Encounter (Signed)
Spoke with patient, notified of results.

## 2024-06-04 ENCOUNTER — Ambulatory Visit: Admitting: Family Medicine

## 2024-06-04 ENCOUNTER — Encounter: Payer: Self-pay | Admitting: Family Medicine

## 2024-06-04 VITALS — BP 134/86 | HR 92 | Ht 73.0 in | Wt 263.0 lb

## 2024-06-04 DIAGNOSIS — I10 Essential (primary) hypertension: Secondary | ICD-10-CM

## 2024-06-04 DIAGNOSIS — Z23 Encounter for immunization: Secondary | ICD-10-CM | POA: Diagnosis not present

## 2024-06-04 DIAGNOSIS — E669 Obesity, unspecified: Secondary | ICD-10-CM | POA: Diagnosis not present

## 2024-06-04 DIAGNOSIS — Z0000 Encounter for general adult medical examination without abnormal findings: Secondary | ICD-10-CM | POA: Diagnosis not present

## 2024-06-04 DIAGNOSIS — Z8619 Personal history of other infectious and parasitic diseases: Secondary | ICD-10-CM | POA: Diagnosis not present

## 2024-06-04 DIAGNOSIS — M722 Plantar fascial fibromatosis: Secondary | ICD-10-CM

## 2024-06-04 DIAGNOSIS — Z720 Tobacco use: Secondary | ICD-10-CM | POA: Diagnosis not present

## 2024-06-04 MED ORDER — CIPROFLOXACIN HCL 500 MG PO TABS: 500.0000 mg | ORAL_TABLET | Freq: Two times a day (BID) | ORAL | 0 refills | Status: AC

## 2024-06-04 NOTE — Progress Notes (Signed)
 Name: KENECHUKWU ECKSTEIN   Date of Visit: 06/04/24   Date of last visit with me: 05/06/2024   CHIEF COMPLAINT:  Chief Complaint  Patient presents with   Annual Exam    CPE fasting labs,        HPI:  Discussed the use of AI scribe software for clinical note transcription with the patient, who gave verbal consent to proceed.  History of Present Illness   FRASER BUSCHE is a 43 year old male with plantar fasciitis and prediabetes who presents with foot pain and concerns about weight gain.  He experiences pain in the bottom of his foot, particularly with the first steps in the morning. The pain improves throughout the day but remains noticeable. His work requires prolonged standing and walking. He previously used shoe inserts from the [employer] store but found them unsatisfactory.  He has gained approximately 25 pounds since he stopped playing soccer, with his weight increasing from 259 pounds in November to 263 pounds today. He attributes this weight gain to decreased physical activity and increased food intake. He is aware of his prediabetic status with a hemoglobin A1c of 5.8%.  He has a history of hypertension, which has improved since his last visit. He feels calmer now, attributing some of his previous anxiety to high blood pressure. He is currently on medication for hypertension, which he reports does not bother him and helps him feel more relaxed.  He has a social history of smoking, though he does not smoke daily and a pack lasts him about a month. He smokes more in social settings or when stressed.  He is planning to travel to [place] and [place] in January and February, respectively, and is concerned about yellow fever and potential gastrointestinal issues based on past experiences in [place]. He recalls a previous foot infection related to moisture and athlete's foot, which has since resolved with treatment.         OBJECTIVE:       06/04/2024    8:36 AM  Depression  screen PHQ 2/9  Decreased Interest 0  Down, Depressed, Hopeless 0  PHQ - 2 Score 0     BP Readings from Last 3 Encounters:  06/04/24 134/86  05/06/24 (!) 150/98  02/01/20 (!) 179/119    BP 134/86   Pulse 92   Ht 6' 1 (1.854 m)   Wt 263 lb (119.3 kg)   BMI 34.70 kg/m    Physical Exam   VITALS: BP- 134/86 MEASUREMENTS: Weight- 263.      Physical Exam  ASSESSMENT/PLAN:   Assessment & Plan Annual physical exam  Plantar fasciitis  Primary hypertension  Tobacco use  Obesity (BMI 30-39.9)  H/O traveler's diarrhea    Assessment and Plan    Adult Wellness Visit Routine visit with slightly elevated blood pressure, weight gain, prediabetes, and occasional smoking. Discussed GLP-1 medications for weight loss, but insurance does not cover due to non-diabetic status. - Ordered basic labs. - Encouraged weight loss through dietary changes and increased physical activity. - Advised on reducing sugar intake. - Discussed GLP-1 medications for weight loss, noting insurance limitations. - Encouraged smoking cessation or reduction. - Recommended yellow fever vaccination before travel to [place] and [place]. - Prescribed antibiotics for potential traveler's diarrhea.  Plantar fasciitis Chronic plantar fasciitis with morning pain, likely exacerbated by prolonged standing and foot pronation issues. Previous custom orthotics from [employer] store. - Recommended wearing thicker sneakers with good insoles, such as Hoka. - Referred  to sports medicine clinic for custom orthotics.  Essential hypertension Slightly elevated blood pressure likely related to weight gain. Current medication effective and well-tolerated. - Continue current antihypertensive medication. - Encouraged weight loss to help manage blood pressure.  Obesity Weight gain of approximately 30 pounds since summer, attributed to decreased physical activity and dietary habits. Discussed potential future affordability  of GLP-1 medications for weight loss. - Encouraged weight loss through dietary changes and increased physical activity. - Discussed potential future use of GLP-1 medications for weight loss.  Tobacco use Occasional social smoking with cigarettes lasting almost a month. Discussed benefits of quitting and potential use of non-nicotine  vapes. - Encouraged smoking cessation or reduction. - Discussed use of non-nicotine  vapes as a cessation aid.    -Smoking/Tobacco Cessation Counseling KAMRYN GAUTHIER is a current user of tobacco or nicotine  products. He is considering quitting at this time. Counseling provided today addressed the risks of continued use and the benefits of cessation. Discussed tobacco/nicotine  use history, readiness to quit, and evidence-based treatment options including behavioral strategies, support resources, and pharmacologic therapies. Provided encouragement and educational materials on steps and resources to quit smoking. Patient questions were addressed, and follow-up recommended for continued support. Total time spent on counseling: 4 minutes.       Kiron Osmun A. Vita MD [facility]

## 2024-06-05 LAB — CMP14+EGFR
ALT: 51 IU/L — ABNORMAL HIGH (ref 0–44)
AST: 33 IU/L (ref 0–40)
Albumin: 4.6 g/dL (ref 4.1–5.1)
Alkaline Phosphatase: 89 IU/L (ref 47–123)
BUN/Creatinine Ratio: 17 (ref 9–20)
BUN: 17 mg/dL (ref 6–24)
Bilirubin Total: 0.9 mg/dL (ref 0.0–1.2)
CO2: 19 mmol/L — ABNORMAL LOW (ref 20–29)
Calcium: 9.9 mg/dL (ref 8.7–10.2)
Chloride: 102 mmol/L (ref 96–106)
Creatinine, Ser: 0.98 mg/dL (ref 0.76–1.27)
Globulin, Total: 2.5 g/dL (ref 1.5–4.5)
Glucose: 81 mg/dL (ref 70–99)
Potassium: 4.8 mmol/L (ref 3.5–5.2)
Sodium: 143 mmol/L (ref 134–144)
Total Protein: 7.1 g/dL (ref 6.0–8.5)
eGFR: 99 mL/min/1.73 (ref >59)

## 2024-06-05 LAB — CBC WITH DIFFERENTIAL/PLATELET
Basophils Absolute: 0 x10E3/uL (ref 0.0–0.2)
Basos: 0 %
EOS (ABSOLUTE): 0.2 x10E3/uL (ref 0.0–0.4)
Eos: 2 %
Hematocrit: 46.8 % (ref 37.5–51.0)
Hemoglobin: 16.1 g/dL (ref 13.0–17.7)
Immature Grans (Abs): 0 x10E3/uL (ref 0.0–0.1)
Immature Granulocytes: 0 %
Lymphocytes Absolute: 2.6 x10E3/uL (ref 0.7–3.1)
Lymphs: 26 %
MCH: 31.8 pg (ref 26.6–33.0)
MCHC: 34.4 g/dL (ref 31.5–35.7)
MCV: 92 fL (ref 79–97)
Monocytes Absolute: 0.8 x10E3/uL (ref 0.1–0.9)
Monocytes: 8 %
Neutrophils Absolute: 6.2 x10E3/uL (ref 1.4–7.0)
Neutrophils: 64 %
Platelets: 188 x10E3/uL (ref 150–450)
RBC: 5.07 x10E6/uL (ref 4.14–5.80)
RDW: 13.6 % (ref 11.6–15.4)
WBC: 9.8 x10E3/uL (ref 3.4–10.8)

## 2024-06-05 LAB — LIPID PANEL
Chol/HDL Ratio: 5.8 ratio — ABNORMAL HIGH (ref 0.0–5.0)
Cholesterol, Total: 219 mg/dL — ABNORMAL HIGH (ref 100–199)
HDL: 38 mg/dL — ABNORMAL LOW (ref >39)
LDL Chol Calc (NIH): 120 mg/dL — ABNORMAL HIGH (ref 0–99)
Triglycerides: 349 mg/dL — ABNORMAL HIGH (ref 0–149)
VLDL Cholesterol Cal: 61 mg/dL — ABNORMAL HIGH (ref 5–40)

## 2024-06-05 LAB — TSH: TSH: 1.57 u[IU]/mL (ref 0.450–4.500)

## 2024-06-07 ENCOUNTER — Encounter: Payer: Self-pay | Admitting: Family Medicine

## 2024-06-07 ENCOUNTER — Ambulatory Visit: Admitting: Family Medicine

## 2024-06-07 ENCOUNTER — Encounter: Admitting: Family Medicine

## 2024-06-07 VITALS — BP 138/88 | Ht 72.0 in | Wt 250.0 lb

## 2024-06-07 DIAGNOSIS — M722 Plantar fascial fibromatosis: Secondary | ICD-10-CM

## 2024-06-07 NOTE — Progress Notes (Signed)
 DATE OF VISIT: 06/07/2024        Adrian Hess DOB: September 11, 1980 MRN: [id number]  CC: Custom orthotics  History of present Illness: Adrian Hess is a 43 y.o. 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   ciprofloxacin  (CIPRO ) 500 MG tablet Take 1 tablet (500 mg total) by mouth 2 (two) times daily for 10 days.   lisinopril  (ZESTRIL ) 20 MG tablet Take 1 tablet (20 mg total) by mouth daily.   No facility-administered encounter medications on file as of 06/07/2024.    Allergies: is allergic to percocet [oxycodone -acetaminophen ].  Physical Examination: Vitals: BP 138/88   Ht 6' (1.829 m)   Wt 250 lb (113.4 kg)   BMI 33.91 kg/m  GENERAL:  Adrian Hess is a 43 y.o. male appearing their stated age, alert and oriented x 3, in no apparent distress.  MSK: Bilateral foot and ankle with moderate longitudinal arches.  Slight overpronation bilaterally.  Transverse arches are preserved.  Walking without a limp Neurovascular intact distally  Assessment & Plan Plantar fasciitis Chronic plantar fasciitis, has done well with custom orthotics in the past, currently on his feet for extended periods for work.  Candidate for updated custom orthotics  Plan: - Custom orthotics fabricated in the office as noted below.  These were comfortable - Did discuss possibility of additional sets for his specific work boots, as well as soccer shoes.  He is aware that each that would be $190.  He will consider this - Should continue to follow-up with Dr. Vita as scheduled, can follow-up with us  as  needed  Patient was fitted for a : [employer]n Run semi-rigid orthotic  The orthotic was heated, placed on the orthotic stand. The patient was positioned in subtalar neutral position and 10 degrees of ankle dorsiflexion in a weight bearing stance on the heated orthotic blank After completion of molding Blank: Fit 'n Run - size 11 Posting:  none Base: none  Orthotics were comfortable in the office and he had more neutral gait and alignment    Patient expressed understanding & agreement with above.  Encounter Diagnosis  Name Primary?   Plantar fasciitis Yes    No orders of the defined types were placed in this encounter.

## 2024-06-07 NOTE — Patient Instructions (Signed)
 Thank you for coming to see me today. It was a pleasure.   We made you new orthotics. Let us know if we need to add any extra cushioning or make changes.  If you have any questions or concerns, please do not hesitate to call the office at [phone].

## 2024-06-08 ENCOUNTER — Ambulatory Visit: Payer: Self-pay | Admitting: Family Medicine

## 2024-06-16 MED ORDER — ATORVASTATIN CALCIUM 20 MG PO TABS: 20.0000 mg | ORAL_TABLET | Freq: Every day | ORAL | 3 refills | Status: AC

## 2024-07-07 ENCOUNTER — Ambulatory Visit: Payer: Self-pay

## 2024-07-07 NOTE — Telephone Encounter (Signed)
 Called [facility] and advised of patient's symptoms and ER disposition at this time.---unsure if patient is going to go at this time.

## 2024-07-07 NOTE — Telephone Encounter (Signed)
 FYI Only or Action Required?: Action required by provider: clinical question for provider, update on patient condition, and ER is advised---patient wanted an appointment in the office initially but described his finger as obvious broken and agreed that it was obviously deformed and swollen.  Patient was last seen in primary care on 06/04/2024 by [name], MD.  Called Nurse Triage reporting Finger Injury.  Symptoms began yesterday.  Interventions attempted: OTC medications: Tylenol , Rest, hydration, or home remedies, and Other: patient made his own splint.  Symptoms are: gradually worsening.  Triage Disposition: Go to ED Now (Notify PCP)  Patient/caregiver understands and will follow disposition?: Unsure               Copied from CRM [id number]. Topic: Clinical - Red Word Triage >> Jul 07, 2024  1:09 PM China J wrote: [facility] that prompted transfer to Nurse Triage: Patient said that he broke his right pinky yesterday. He is also having swelling around his pinky toe and it's changing to the color purple. Reason for Disposition  Sounds like a serious injury to the triager  Answer Assessment - Initial Assessment Questions Patient states that yesterday he broken his pinky finger (5th digit on right hand) playing basketball---patient states it is obviously  Patient also states that he has a bump next to 5th digit on the right foot.   (5th toe on right foot). He states that the medication he was given healed this infection but then a bump appeared.  He states the area is turning purple.   Patient states that his pinky finger is obviously broken.  He state that is it obviously deformed and swollen, red as well. Patient states there is obviously something wrong with it He states that he made his own splint to hold it straight and had taken Tylenol  but the pain got a lot worse today. He is advised that at this time it is recommended that he goes to the Emergency Room for further  evaluation. Patient is also advised to call us  back if anything changes or worsens. He is advised that if anything worsens to call 911. Patient verbalized understanding.  Protocols used: Finger Injury-A-AH

## 2024-07-21 ENCOUNTER — Telehealth: Payer: Self-pay | Admitting: Family Medicine

## 2024-07-21 ENCOUNTER — Ambulatory Visit: Payer: Self-pay

## 2024-07-21 NOTE — Telephone Encounter (Signed)
 FYI Only or Action Required?: Action required by provider: update on patient condition.  Patient was last seen in primary care on 06/04/2024 by [name], MD.  Called Nurse Triage reporting Foot Pain.  Symptoms began several weeks ago.  Interventions attempted: Rest, hydration, or home remedies.  Symptoms are: gradually worsening.Pt. Reports right foot has worsened, purple in color, painful. He is going to ED.  Triage Disposition: Go to ED Now (Notify PCP)  Patient/caregiver understands and will follow disposition?: Yes       Reason for Triage:  Pt is still having symptoms from previous triage. He stats that his foot is not healing; it is still swollen and purple.  *Pt was given painkillers and antibiotics and it is not helping. He also has a form that pcp needs to fill out for an insurance claim on previous visit.  Pt said we dont have any MRI Services here so he will be going to the ER.    Reason for Disposition  Purple or black skin on foot or toe  Protocols used: Foot Pain-A-AH

## 2024-07-21 NOTE — Telephone Encounter (Signed)
 Pt brought by [employer] for complettion, he needs by Friday if possible  States he has been out of work for 2 months, been to Urgent Care, Podiatry & now they are recommending ER

## 2024-07-22 NOTE — Telephone Encounter (Signed)
 Spoke to patient, he states Dr. Vita verbally took him out of work because he could not work. 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 Will contact Dr. Vita to check on this

## 2024-07-22 NOTE — Telephone Encounter (Unsigned)
 Copied from CRM #[id number]. Topic: General - Other >> Jul 22, 2024  3:34 PM Antwanette L wrote: Reason for CRM: Patient is calling regarding the attending physician statement explaining the injury to his foot.  The paperwork is from [facility].He wants to know whether Dr. Vita has completed the paperwork. The forms were turned in two days ago and needs to be completed by Friday. He is requesting a status update. Please contact the patient when the paperwork is ready. Patient can be  reached at [phone]

## 2024-07-22 NOTE — Telephone Encounter (Signed)
Attempted to contact patient had to leave voice mail.

## 2024-07-22 NOTE — Telephone Encounter (Signed)
 Per our conversation, please document patients details

## 2024-07-25 ENCOUNTER — Inpatient Hospital Stay (HOSPITAL_COMMUNITY)
Admission: EM | Admit: 2024-07-25 | Discharge: 2024-07-29 | DRG: 392 | Disposition: A | Discharge status: Home / Self Care | Attending: Internal Medicine | Admitting: Internal Medicine

## 2024-07-25 ENCOUNTER — Emergency Department (HOSPITAL_COMMUNITY)

## 2024-07-25 ENCOUNTER — Other Ambulatory Visit: Payer: Self-pay

## 2024-07-25 DIAGNOSIS — Z8249 Family history of ischemic heart disease and other diseases of the circulatory system: Secondary | ICD-10-CM

## 2024-07-25 DIAGNOSIS — K760 Fatty (change of) liver, not elsewhere classified: Secondary | ICD-10-CM | POA: Diagnosis present

## 2024-07-25 DIAGNOSIS — Z881 Allergy status to other antibiotic agents status: Secondary | ICD-10-CM

## 2024-07-25 DIAGNOSIS — M2578 Osteophyte, vertebrae: Secondary | ICD-10-CM | POA: Diagnosis present

## 2024-07-25 DIAGNOSIS — F17290 Nicotine dependence, other tobacco product, uncomplicated: Secondary | ICD-10-CM | POA: Diagnosis present

## 2024-07-25 DIAGNOSIS — D649 Anemia, unspecified: Secondary | ICD-10-CM | POA: Diagnosis present

## 2024-07-25 DIAGNOSIS — Z79899 Other long term (current) drug therapy: Secondary | ICD-10-CM

## 2024-07-25 DIAGNOSIS — Z885 Allergy status to narcotic agent status: Secondary | ICD-10-CM

## 2024-07-25 DIAGNOSIS — K5909 Other constipation: Secondary | ICD-10-CM | POA: Diagnosis present

## 2024-07-25 DIAGNOSIS — K5792 Diverticulitis of intestine, part unspecified, without perforation or abscess without bleeding: Principal | ICD-10-CM | POA: Diagnosis present

## 2024-07-25 DIAGNOSIS — E782 Mixed hyperlipidemia: Secondary | ICD-10-CM | POA: Diagnosis present

## 2024-07-25 DIAGNOSIS — G4700 Insomnia, unspecified: Secondary | ICD-10-CM | POA: Diagnosis present

## 2024-07-25 DIAGNOSIS — M48061 Spinal stenosis, lumbar region without neurogenic claudication: Secondary | ICD-10-CM | POA: Diagnosis present

## 2024-07-25 DIAGNOSIS — Z6834 Body mass index (BMI) 34.0-34.9, adult: Secondary | ICD-10-CM

## 2024-07-25 DIAGNOSIS — E66811 Obesity, class 1: Secondary | ICD-10-CM | POA: Diagnosis present

## 2024-07-25 DIAGNOSIS — K5732 Diverticulitis of large intestine without perforation or abscess without bleeding: Principal | ICD-10-CM | POA: Diagnosis present

## 2024-07-25 DIAGNOSIS — Z720 Tobacco use: Secondary | ICD-10-CM | POA: Diagnosis present

## 2024-07-25 DIAGNOSIS — Z818 Family history of other mental and behavioral disorders: Secondary | ICD-10-CM

## 2024-07-25 DIAGNOSIS — Z809 Family history of malignant neoplasm, unspecified: Secondary | ICD-10-CM

## 2024-07-25 DIAGNOSIS — F17210 Nicotine dependence, cigarettes, uncomplicated: Secondary | ICD-10-CM | POA: Diagnosis present

## 2024-07-25 DIAGNOSIS — M4316 Spondylolisthesis, lumbar region: Secondary | ICD-10-CM | POA: Diagnosis present

## 2024-07-25 DIAGNOSIS — M160 Bilateral primary osteoarthritis of hip: Secondary | ICD-10-CM | POA: Diagnosis present

## 2024-07-25 DIAGNOSIS — I10 Essential (primary) hypertension: Secondary | ICD-10-CM | POA: Diagnosis present

## 2024-07-25 DIAGNOSIS — Z833 Family history of diabetes mellitus: Secondary | ICD-10-CM

## 2024-07-25 LAB — COMPREHENSIVE METABOLIC PANEL WITH GFR
ALT: 53 U/L — ABNORMAL HIGH (ref 0–44)
AST: 39 U/L (ref 15–41)
Albumin: 4.5 g/dL (ref 3.5–5.0)
Alkaline Phosphatase: 90 U/L (ref 38–126)
Anion gap: 11 (ref 5–15)
BUN: 12 mg/dL (ref 6–20)
CO2: 25 mmol/L (ref 22–32)
Calcium: 9.3 mg/dL (ref 8.9–10.3)
Chloride: 99 mmol/L (ref 98–111)
Creatinine, Ser: 1.02 mg/dL (ref 0.61–1.24)
GFR, Estimated: >60 mL/min
Glucose, Bld: 117 mg/dL — ABNORMAL HIGH (ref 70–99)
Potassium: 3.9 mmol/L (ref 3.5–5.1)
Sodium: 135 mmol/L (ref 135–145)
Total Bilirubin: 1.8 mg/dL — ABNORMAL HIGH (ref 0.0–1.2)
Total Protein: 7.9 g/dL (ref 6.5–8.1)

## 2024-07-25 LAB — CBC WITH DIFFERENTIAL/PLATELET
Abs Immature Granulocytes: 0.07 10*3/uL (ref 0.00–0.07)
Basophils Absolute: 0 10*3/uL (ref 0.0–0.1)
Basophils Relative: 0 %
Eosinophils Absolute: 0.1 10*3/uL (ref 0.0–0.5)
Eosinophils Relative: 1 %
HCT: 43.5 % (ref 39.0–52.0)
Hemoglobin: 14.9 g/dL (ref 13.0–17.0)
Immature Granulocytes: 0 %
Lymphocytes Relative: 8 %
Lymphs Abs: 1.4 10*3/uL (ref 0.7–4.0)
MCH: 31.2 pg (ref 26.0–34.0)
MCHC: 34.3 g/dL (ref 30.0–36.0)
MCV: 91 fL (ref 80.0–100.0)
Monocytes Absolute: 1.3 10*3/uL — ABNORMAL HIGH (ref 0.1–1.0)
Monocytes Relative: 8 %
Neutro Abs: 13.8 10*3/uL — ABNORMAL HIGH (ref 1.7–7.7)
Neutrophils Relative %: 83 %
Platelets: 189 10*3/uL (ref 150–400)
RBC: 4.78 MIL/uL (ref 4.22–5.81)
RDW: 12.7 % (ref 11.5–15.5)
WBC: 16.7 10*3/uL — ABNORMAL HIGH (ref 4.0–10.5)
nRBC: 0 % (ref 0.0–0.2)

## 2024-07-25 LAB — URINALYSIS, ROUTINE W REFLEX MICROSCOPIC
Bilirubin Urine: NEGATIVE
Glucose, UA: NEGATIVE mg/dL
Hgb urine dipstick: NEGATIVE
Ketones, ur: NEGATIVE mg/dL
Leukocytes,Ua: NEGATIVE
Nitrite: NEGATIVE
Protein, ur: NEGATIVE mg/dL
Specific Gravity, Urine: 1.015 (ref 1.005–1.030)
pH: 5 (ref 5.0–8.0)

## 2024-07-25 LAB — LIPASE, BLOOD: Lipase: 18 U/L (ref 11–51)

## 2024-07-25 MED ORDER — ATORVASTATIN CALCIUM 10 MG PO TABS
20.0000 mg | ORAL_TABLET | Freq: Every day | ORAL | Status: DC
  Administered 2024-07-26 – 2024-07-29 (×4): 20 mg via ORAL
  Filled 2024-07-25 (×4): qty 2

## 2024-07-25 MED ORDER — ACETAMINOPHEN 650 MG RE SUPP: 650.0000 mg | Freq: Four times a day (QID) | RECTAL | Status: DC | PRN

## 2024-07-25 MED ORDER — ACETAMINOPHEN 325 MG PO TABS
650.0000 mg | ORAL_TABLET | Freq: Four times a day (QID) | ORAL | Status: DC | PRN
  Administered 2024-07-26: 650 mg via ORAL
  Filled 2024-07-25: qty 2

## 2024-07-25 MED ORDER — HYDROCODONE-ACETAMINOPHEN 5-325 MG PO TABS
1.0000 | ORAL_TABLET | Freq: Four times a day (QID) | ORAL | Status: DC | PRN
  Administered 2024-07-26 – 2024-07-29 (×10): 1 via ORAL
  Filled 2024-07-25 (×10): qty 1

## 2024-07-25 MED ORDER — SENNOSIDES-DOCUSATE SODIUM 8.6-50 MG PO TABS: 1.0000 | ORAL_TABLET | Freq: Every evening | ORAL | Status: DC | PRN

## 2024-07-25 MED ORDER — ONDANSETRON HCL 4 MG/2ML IJ SOLN
4.0000 mg | Freq: Once | INTRAMUSCULAR | Status: AC
  Administered 2024-07-25: 4 mg via INTRAVENOUS
  Filled 2024-07-25: qty 2

## 2024-07-25 MED ORDER — IOHEXOL 300 MG/ML  SOLN
100.0000 mL | Freq: Once | INTRAMUSCULAR | Status: AC | PRN
  Administered 2024-07-25: 100 mL via INTRAVENOUS

## 2024-07-25 MED ORDER — BACITRACIN ZINC 500 UNIT/GM EX OINT
TOPICAL_OINTMENT | CUTANEOUS | Status: AC
  Filled 2024-07-25: qty 0.9

## 2024-07-25 MED ORDER — PIPERACILLIN-TAZOBACTAM 3.375 G IVPB
3.3750 g | Freq: Three times a day (TID) | INTRAVENOUS | Status: DC
  Administered 2024-07-25 – 2024-07-29 (×11): 3.375 g via INTRAVENOUS
  Filled 2024-07-25 (×11): qty 50

## 2024-07-25 MED ORDER — HYDROMORPHONE HCL 1 MG/ML IJ SOLN
1.0000 mg | Freq: Once | INTRAMUSCULAR | Status: AC
  Administered 2024-07-25: 1 mg via INTRAVENOUS
  Filled 2024-07-25: qty 1

## 2024-07-25 MED ORDER — ONDANSETRON HCL 4 MG PO TABS: 4.0000 mg | ORAL_TABLET | Freq: Four times a day (QID) | ORAL | Status: DC | PRN

## 2024-07-25 MED ORDER — PIPERACILLIN-TAZOBACTAM 3.375 G IVPB 30 MIN
3.3750 g | Freq: Once | INTRAVENOUS | Status: AC
  Administered 2024-07-25: 3.375 g via INTRAVENOUS
  Filled 2024-07-25: qty 50

## 2024-07-25 MED ORDER — MELATONIN 3 MG PO TABS: 3.0000 mg | ORAL_TABLET | Freq: Every evening | ORAL | Status: DC | PRN

## 2024-07-25 MED ORDER — LISINOPRIL 20 MG PO TABS
20.0000 mg | ORAL_TABLET | Freq: Every day | ORAL | Status: DC
  Administered 2024-07-26 – 2024-07-29 (×4): 20 mg via ORAL
  Filled 2024-07-25: qty 2
  Filled 2024-07-25 (×3): qty 1

## 2024-07-25 MED ORDER — SODIUM CHLORIDE 0.9 % IV SOLN: INTRAVENOUS | Status: DC

## 2024-07-25 MED ORDER — ENOXAPARIN SODIUM 40 MG/0.4ML IJ SOSY
40.0000 mg | PREFILLED_SYRINGE | Freq: Every day | INTRAMUSCULAR | Status: DC
  Administered 2024-07-25 – 2024-07-29 (×5): 40 mg via SUBCUTANEOUS
  Filled 2024-07-25 (×5): qty 0.4

## 2024-07-25 MED ORDER — MORPHINE SULFATE (PF) 4 MG/ML IV SOLN
4.0000 mg | Freq: Once | INTRAVENOUS | Status: AC
  Administered 2024-07-25: 4 mg via INTRAVENOUS
  Filled 2024-07-25: qty 1

## 2024-07-25 MED ORDER — ONDANSETRON HCL 4 MG/2ML IJ SOLN: 4.0000 mg | Freq: Four times a day (QID) | INTRAMUSCULAR | Status: DC | PRN

## 2024-07-25 MED ORDER — BISACODYL 5 MG PO TBEC
5.0000 mg | DELAYED_RELEASE_TABLET | Freq: Every day | ORAL | Status: DC | PRN
  Administered 2024-07-26 – 2024-07-29 (×3): 5 mg via ORAL
  Filled 2024-07-25 (×3): qty 1

## 2024-07-25 MED ORDER — HYDROMORPHONE HCL 1 MG/ML IJ SOLN: 0.5000 mg | Freq: Four times a day (QID) | INTRAMUSCULAR | Status: DC | PRN

## 2024-07-25 MED ORDER — HYDROMORPHONE HCL 1 MG/ML IJ SOLN
0.5000 mg | Freq: Four times a day (QID) | INTRAMUSCULAR | Status: DC | PRN
  Administered 2024-07-25 – 2024-07-28 (×7): 0.5 mg via INTRAVENOUS
  Filled 2024-07-25 (×3): qty 0.5
  Filled 2024-07-25 (×2): qty 1
  Filled 2024-07-25: qty 0.5
  Filled 2024-07-25: qty 1

## 2024-07-25 NOTE — ED Notes (Signed)
 Pt unable to urinate at this time.

## 2024-07-25 NOTE — H&P (Signed)
 " History and Physical  Adrian Hess FMW:[id number] DOB: 02-01-1981 DOA: 07/25/2024  PCP: [name], MD   Chief Complaint: Abdominal pain  HPI: Adrian Hess is a 44 y.o. male with medical history significant for hypertension, hyperlipidemia, insomnia, spinal stenosis and diverticulitis who presented to the ED for evaluation of abdominal pain.  Patient was in his usual state of health until 3 days ago when he started having left lower quadrant pain worse with movements.  He reports associated nausea and chills but no headache, dizziness, vomiting, fevers, constipation, diarrhea, bloody stools, chest pain or shortness of breath. Reports his first diverticulitis flare requiring hospitalization was 6 years ago.  ED Course: Initial vitals show afebrile, HR 90-1 tens, SBP 100-160s. Initial labs significant for WBC 16.7, bilirubin 1.8 but otherwise unremarkable with normal renal function, lipase and LFTs.  CT A/P shows acute sigmoid diverticulitis without abscess or free air. Pt received IV Zosyn , IV morphine , IV Dilaudid  and IV Zofran . [facility] was consulted for admission.   Review of Systems: Please see HPI for pertinent positives and negatives. A complete 10 system review of systems are otherwise negative.  Past Medical History:  Diagnosis Date   Hypertension    Insomnia    Wears contact lenses    Past Surgical History:  Procedure Laterality Date   KNEE SURGERY     right meniscus   MANDIBLE FRACTURE SURGERY     Social History:  reports that he has been smoking cigars and cigarettes. He has never used smokeless tobacco. He reports current alcohol use of about 5.0 standard drinks of alcohol per week. He reports current drug use. Drug: Marijuana.  Allergies[1]  Family History  Problem Relation Age of Onset   Hypertension Mother    Diabetes Mother    Anxiety disorder Mother    Depression Mother    Diabetes Father    Diabetes Sister    Anxiety disorder Sister    Cancer Maternal Grandmother     Heart disease Neg Hx      Prior to Admission medications  Medication Sig Start Date End Date Taking? Authorizing Provider  acetaminophen  (TYLENOL ) 325 MG tablet Take 2 tablets (650 mg total) by mouth every 6 (six) hours as needed for mild pain (or Fever >/= 101). 04/03/19   Gonfa, Taye T, MD  atorvastatin  (LIPITOR) 20 MG tablet Take 1 tablet (20 mg total) by mouth daily. 06/16/24   Jha, Panav, MD  lisinopril  (ZESTRIL ) 20 MG tablet Take 1 tablet (20 mg total) by mouth daily. 05/06/24   [name], MD    Physical Exam: BP 127/66   Pulse 93   Temp 98.4 F (36.9 C)   Resp 18   SpO2 98%  General: Pleasant, acutely ill middle-age man laying in bed. No acute distress. HEENT: [place]/AT. Anicteric sclera CV: RRR. No murmurs, rubs, or gallops. No LE edema Pulmonary: Lungs CTAB. Normal effort. No wheezing or rales. Abdominal: Soft. Generalized TTP, worse in the LLQ. Normal bowel sounds. Extremities: Palpable radial and DP pulses. Normal ROM. Skin: Warm and dry. No obvious rash or lesions. Neuro: A&Ox3. Moves all extremities. Normal sensation to light touch. No focal deficit. Psych: Normal mood and affect          Labs on Admission:  Basic Metabolic Panel: Recent Labs  Lab 07/25/24 1311  NA 135  K 3.9  CL 99  CO2 25  GLUCOSE 117*  BUN 12  CREATININE 1.02  CALCIUM  9.3   Liver Function Tests: Recent Labs  Lab 07/25/24 1311  AST 39  ALT 53*  ALKPHOS 90  BILITOT 1.8*  PROT 7.9  ALBUMIN 4.5   Recent Labs  Lab 07/25/24 1311  LIPASE 18   No results for input(s): AMMONIA in the last 168 hours. CBC: Recent Labs  Lab 07/25/24 1311  WBC 16.7*  NEUTROABS 13.8*  HGB 14.9  HCT 43.5  MCV 91.0  PLT 189   Cardiac Enzymes: No results for input(s): CKTOTAL, CKMB, CKMBINDEX, TROPONINI in the last 168 hours. BNP (last 3 results) No results for input(s): BNP in the last 8760 hours.  ProBNP (last 3 results) No results for input(s): PROBNP in the last 8760  hours.  CBG: No results for input(s): GLUCAP in the last 168 hours.  Radiological Exams on Admission: CT ABDOMEN PELVIS W CONTRAST Result Date: 07/25/2024 EXAM: CT ABDOMEN AND PELVIS WITH CONTRAST 07/25/2024 01:38:46 PM TECHNIQUE: CT of the abdomen and pelvis was performed with the administration of 100 mL of iohexol  (OMNIPAQUE ) 300 MG/ML solution. Multiplanar reformatted images are provided for review. Automated exposure control, iterative reconstruction, and/or weight-based adjustment of the mA/kV was utilized to reduce the radiation dose to as low as reasonably achievable. COMPARISON: 04/01/2019 exam. CLINICAL HISTORY: Abdominal pain. FINDINGS: LIMITATIONS/ARTIFACTS: Motion artifact is present, reducing diagnostic sensitivity and specificity. LOWER CHEST: No acute abnormality. LIVER: Suspected hepatic steatosis. GALLBLADDER AND BILE DUCTS: Gallbladder is unremarkable. No biliary ductal dilatation. SPLEEN: No acute abnormality. PANCREAS: No acute abnormality. ADRENAL GLANDS: No acute abnormality. KIDNEYS, URETERS AND BLADDER: No stones in the kidneys or ureters. No hydronephrosis. No perinephric or periureteral stranding. Urinary bladder is unremarkable. GI AND BOWEL: Stomach demonstrates no acute abnormality. Prominently inflamed calcified segment of sigmoid colon with marked wall thickening and mild thumbprinting along with accentuated mucosal enhancement in the involved segment, with striking surrounding inflammatory stranding in the mesocolon. The appearance favors acute diverticulitis, and it involves a segment similar to the 04/01/2019 exam. No discrete abscess or extraluminal gas is seen. Fluid level in the rectum. There is no bowel obstruction. PERITONEUM AND RETROPERITONEUM: Trace amount of free fluid adjacent to the cecum. No free air. VASCULATURE: Aorta is normal in caliber. LYMPH NODES: No lymphadenopathy. REPRODUCTIVE ORGANS: No acute abnormality. BONES AND SOFT TISSUES: Mild degenerative  arthropathy of both hips. Chronic bilateral pars defects at L3 with 3 mm of anterolisthesis of L3 on L4, and moderate bilateral foraminal stenosis at L3-L4 due to disc osteophyte complex and disc uncovering. Small umbilical hernia contains adipose tissue. IMPRESSION: 1. Acute sigmoid diverticulitis without abscess or free air. The involved 12 cm segment of the the sigmoid colon is severely inflamed with a notable degree of surrounding inflammatory stranding in the mesocolon. No portal venous gas or pneumatosis. 2. Trace free fluid adjacent to the cecum. 3. Suspected hepatic steatosis. 4. Chronic bilateral L3 pars defects with 3 mm anterolisthesis of L3 on L4 and moderate bilateral foraminal stenosis at L3-4 related to disc osteophyte complex and disc uncovering. 5. Mild degenerative arthropathy of both hips. 6. Small umbilical hernia containing adipose tissue. Electronically signed by: Ryan Salvage MD 07/25/2024 02:37 PM EST RP Workstation: [id number]   Assessment/Plan ROJELIO UHRICH is a 44 y.o. male with medical history significant for hypertension, hyperlipidemia, insomnia, spinal stenosis and diverticulitis who presented to the ED for evaluation of abdominal pain and admitted for diverticulitis.  # Acute sigmoid diverticulitis - Patient with history of diverticulitis presenting with 3 days of abdominal pain with associated nausea and chills - CT A/P shows acute sigmoid diverticulitis  without abscess or free air - Pain not improved with multiple IV pain meds in the ED - Admit for IV antibiotics and pain control - Continue IV Zosyn  - Pain control with as needed Norco and IV Dilaudid  - Start IV NS at 100 cc/h for 20 hours - Clear liquid diet, advance as tolerated - Trend CBC, fever curve  # Hypertension - SBP in the 100-160s - Continue lisinopril   # Hyperlipidemia - Continue atorvastatin   # Insomnia - As needed melatonin   DVT prophylaxis: Lovenox      Code Status: Full  Code  Consults called: None  Family Communication: No family at bedside  Severity of Illness: The appropriate patient status for this patient is INPATIENT. Inpatient status is judged to be reasonable and necessary in order to provide the required intensity of service to ensure the patient's safety. The patient's presenting symptoms, physical exam findings, and initial radiographic and laboratory data in the context of their chronic comorbidities is felt to place them at high risk for further clinical deterioration. Furthermore, it is not anticipated that the patient will be medically stable for discharge from the hospital within 2 midnights of admission.   * I certify that at the point of admission it is my clinical judgment that the patient will require inpatient hospital care spanning beyond 2 midnights from the point of admission due to high intensity of service, high risk for further deterioration and high frequency of surveillance required.*  Level of care: Med-Surg    Lou Claretta HERO, MD 07/25/2024, 7:25 PM [facility] Pager: [phone] Isaiah 41:10   If 7PM-7AM, please contact night-coverage [email or web address] Password [email or web address]     [1]  Allergies Allergen Reactions   Percocet [Oxycodone -Acetaminophen ] Hypertension    irriatable   "

## 2024-07-25 NOTE — ED Provider Notes (Signed)
 " [place] EMERGENCY DEPARTMENT AT [facility] Provider Note   CSN: [id number] Arrival date & time: 07/25/24  1239     Patient presents with: Abdominal Pain   Adrian Hess is a 44 y.o. male.    Abdominal Pain Associated symptoms: constipation   44 year old male presenting to the ER with abdominal pain.  His medical history is significant for diverticulosis.  Patient reports that he has been having pain for about 4 days now.  Patient reports that the pain is, generalized with the majority of the pain on the left side.  He is now experiencing some nausea with this.  He also has been experiencing some constipation however did take a laxative and has since had a bowel movement.     Prior to Admission medications  Medication Sig Start Date End Date Taking? Authorizing Provider  acetaminophen  (TYLENOL ) 325 MG tablet Take 2 tablets (650 mg total) by mouth every 6 (six) hours as needed for mild pain (or Fever >/= 101). 04/03/19   Gonfa, Taye T, MD  atorvastatin  (LIPITOR) 20 MG tablet Take 1 tablet (20 mg total) by mouth daily. 06/16/24   Jha, Panav, MD  lisinopril  (ZESTRIL ) 20 MG tablet Take 1 tablet (20 mg total) by mouth daily. 05/06/24   Jha, Panav, MD    Allergies: Percocet [oxycodone -acetaminophen ]    Review of Systems  Gastrointestinal:  Positive for abdominal pain and constipation.  All other systems reviewed and are negative.   Updated Vital Signs BP (!) 166/110 (BP Location: Right Arm)   Pulse (!) 110   Temp 98.5 F (36.9 C) (Oral)   Resp 18   SpO2 100%   Physical Exam Vitals and nursing note reviewed.  HENT:     Mouth/Throat:     Pharynx: Oropharynx is clear.  Cardiovascular:     Rate and Rhythm: Normal rate.     Pulses: Normal pulses.  Pulmonary:     Effort: Pulmonary effort is normal.     Breath sounds: Normal breath sounds.  Abdominal:     General: Abdomen is flat. Bowel sounds are normal.     Palpations: Abdomen is soft.     Tenderness: There  is generalized abdominal tenderness and tenderness in the left lower quadrant. There is guarding. There is no rebound. Negative signs include Murphy's sign, Rovsing's sign and McBurney's sign.  Skin:    General: Skin is warm and dry.  Neurological:     General: No focal deficit present.     Mental Status: He is alert.     (all labs ordered are listed, but only abnormal results are displayed) Labs Reviewed - No data to display  EKG: None  Radiology: No results found.   Procedures   Medications Ordered in the ED - No data to display                                  Medical Decision Making  Impression: 44 year old male presenting with abdominal pain.  Differential diagnoses include pancreatitis, diverticulitis, diverticulosis, cholecystitis, small bowel obstruction  Additional History: Patient was able to provide history.  Also reviewed other outpatient notes.  Labs: CBC showed WBC of 16.7.  CMP showed no acute changes.  Lipase shows 18 urinalysis showed no acute changes  Imaging: CT of the abdomen and pelvis showed   Acute sigmoid diverticulitis without abscess or free air. The involved 12 cm  segment of the the  sigmoid colon is severely inflamed with a notable degree of  surrounding inflammatory stranding in the mesocolon. No portal venous gas or  pneumatosis.  2. Trace free fluid adjacent to the cecum.  3. Suspected hepatic steatosis.  4. Chronic bilateral L3 pars defects with 3 mm anterolisthesis of L3 on L4 and  moderate bilateral foraminal stenosis at L3-4 related to disc osteophyte  complex and disc uncovering.  5. Mild degenerative arthropathy of both hips.  6. Small umbilical hernia containing adipose tissue.   I reviewed these images and agree with radiology's interpretation.  Consult:  ED Course/Meds: 44 year old male presenting to the ER for abdominal pain.  Patient was in obvious pain.  Patient reports that he started develop some abdominal pain about 4  days ago.  Since then the pain has been getting progressively worse and worse.  Patient does have a positive history for diverticulitis for which he had to be hospitalized.  Patient also reports that he has not been able to have a bowel movement.  CT showed diverticulitis with severe inflammation.  There was no abscess or perforation noted.  Patient was given both morphine  and Dilaudid  and was still having uncontrolled pain.  Patient was also given Zofran  and was still nauseous whenever he would drink any sort of fluid.  Patient was also started on Zosyn  for antibiotics.  Because of this I thought he would be a candidate for admission.  I spoke with Dr. Lou who would be the admitting doctor.  I spoke with the patient and he was okay with the admission.  All questions were answered.  Patient remained stable while in the ER.      Final diagnoses:  None    ED Discharge Orders     None          Adrian Hess 07/25/24 1915    Adrian Ozell LABOR, DO 07/25/24 2134  "

## 2024-07-25 NOTE — ED Triage Notes (Signed)
 Hx of diverticulitis. Pain started 3 days ago, worsening yesterday. Urine started becoming red this morning. Constipation with mucous like stools.

## 2024-07-26 ENCOUNTER — Telehealth: Payer: Self-pay | Admitting: Family Medicine

## 2024-07-26 DIAGNOSIS — E782 Mixed hyperlipidemia: Secondary | ICD-10-CM

## 2024-07-26 DIAGNOSIS — G4700 Insomnia, unspecified: Secondary | ICD-10-CM | POA: Diagnosis not present

## 2024-07-26 DIAGNOSIS — I10 Essential (primary) hypertension: Secondary | ICD-10-CM | POA: Diagnosis not present

## 2024-07-26 DIAGNOSIS — K5792 Diverticulitis of intestine, part unspecified, without perforation or abscess without bleeding: Secondary | ICD-10-CM

## 2024-07-26 LAB — CBC WITH DIFFERENTIAL/PLATELET
Abs Immature Granulocytes: 0.07 10*3/uL (ref 0.00–0.07)
Basophils Absolute: 0 10*3/uL (ref 0.0–0.1)
Basophils Relative: 0 %
Eosinophils Absolute: 0.1 10*3/uL (ref 0.0–0.5)
Eosinophils Relative: 1 %
HCT: 41 % (ref 39.0–52.0)
Hemoglobin: 13.4 g/dL (ref 13.0–17.0)
Immature Granulocytes: 0 %
Lymphocytes Relative: 7 %
Lymphs Abs: 1.3 10*3/uL (ref 0.7–4.0)
MCH: 30.7 pg (ref 26.0–34.0)
MCHC: 32.7 g/dL (ref 30.0–36.0)
MCV: 94 fL (ref 80.0–100.0)
Monocytes Absolute: 0.9 10*3/uL (ref 0.1–1.0)
Monocytes Relative: 5 %
Neutro Abs: 15.3 10*3/uL — ABNORMAL HIGH (ref 1.7–7.7)
Neutrophils Relative %: 87 %
Platelets: 176 10*3/uL (ref 150–400)
RBC: 4.36 MIL/uL (ref 4.22–5.81)
RDW: 12.8 % (ref 11.5–15.5)
WBC: 17.6 10*3/uL — ABNORMAL HIGH (ref 4.0–10.5)
nRBC: 0 % (ref 0.0–0.2)

## 2024-07-26 LAB — CBC
HCT: 40.9 % (ref 39.0–52.0)
Hemoglobin: 13.6 g/dL (ref 13.0–17.0)
MCH: 31.6 pg (ref 26.0–34.0)
MCHC: 33.3 g/dL (ref 30.0–36.0)
MCV: 94.9 fL (ref 80.0–100.0)
Platelets: 164 10*3/uL (ref 150–400)
RBC: 4.31 MIL/uL (ref 4.22–5.81)
RDW: 12.9 % (ref 11.5–15.5)
WBC: 17.5 10*3/uL — ABNORMAL HIGH (ref 4.0–10.5)
nRBC: 0 % (ref 0.0–0.2)

## 2024-07-26 LAB — HEPATIC FUNCTION PANEL
ALT: 36 U/L (ref 0–44)
AST: 23 U/L (ref 15–41)
Albumin: 4.2 g/dL (ref 3.5–5.0)
Alkaline Phosphatase: 81 U/L (ref 38–126)
Bilirubin, Direct: 0.8 mg/dL — ABNORMAL HIGH (ref 0.0–0.2)
Indirect Bilirubin: 1.1 mg/dL — ABNORMAL HIGH (ref 0.3–0.9)
Total Bilirubin: 1.9 mg/dL — ABNORMAL HIGH (ref 0.0–1.2)
Total Protein: 7.6 g/dL (ref 6.5–8.1)

## 2024-07-26 LAB — PHOSPHORUS: Phosphorus: 1.8 mg/dL — ABNORMAL LOW (ref 2.5–4.6)

## 2024-07-26 LAB — MAGNESIUM: Magnesium: 2.1 mg/dL (ref 1.7–2.4)

## 2024-07-26 LAB — BASIC METABOLIC PANEL WITH GFR
Anion gap: 10 (ref 5–15)
BUN: 11 mg/dL (ref 6–20)
CO2: 26 mmol/L (ref 22–32)
Calcium: 9 mg/dL (ref 8.9–10.3)
Chloride: 101 mmol/L (ref 98–111)
Creatinine, Ser: 1.14 mg/dL (ref 0.61–1.24)
GFR, Estimated: >60 mL/min
Glucose, Bld: 113 mg/dL — ABNORMAL HIGH (ref 70–99)
Potassium: 3.8 mmol/L (ref 3.5–5.1)
Sodium: 137 mmol/L (ref 135–145)

## 2024-07-26 MED ORDER — POTASSIUM PHOSPHATES 15 MMOLE/5ML IV SOLN
30.0000 mmol | Freq: Once | INTRAVENOUS | Status: AC
  Administered 2024-07-26: 30 mmol via INTRAVENOUS
  Filled 2024-07-26: qty 30

## 2024-07-26 MED ORDER — SODIUM CHLORIDE 0.9 % IV SOLN: INTRAVENOUS | Status: AC

## 2024-07-26 NOTE — Progress Notes (Signed)
 " PROGRESS NOTE    Adrian Hess  FMW:[id number] DOB: [birth year] DOA: 07/25/2024 PCP: [name], MD   Brief Narrative:  Adrian Hess is a 44 y.o. male with medical history significant for hypertension, hyperlipidemia, insomnia, spinal stenosis and diverticulitis who presented to the ED for evaluation of abdominal pain.  Patient was in his usual state of health until 3 days ago when he started having left lower quadrant pain worse with movements.  Found to have Acute Sigmoid Diverticulitis.  Assessment and Plan:  Acute Sigmoid Diverticulitis: Patient with history of diverticulitis presenting with 3 days of abdominal pain with associated nausea and chills. CT A/P showed Acute sigmoid diverticulitis without abscess or free air. The involved 12 cm segment of the the sigmoid colon is severely inflamed with a notable degree of surrounding inflammatory stranding in the mesocolon. No portal venous gas or pneumatosis. Trace free fluid adjacent to the cecum -Admitted for IV antibiotics and pain control --Pain Control w/ Acetaminophen  650 mg po/RC q6hprn Mild Pain, Hydrocodone -Acetaminophen  1 tab po q6hprn Moderate Pain, and IV Hydromorphone  0.5 mg q6hprn Severe Pain -C/w IVF w/ NS @ 100 mL/hr x 1 Day -C/w Clear Diet and Advance Diet as Tolerated -WBC Trend went from 16.7 -> 17.5.  -Enoxaparin  40 mg sq Daily  -Trend CBC, fever curve  Pars Defects: CT showed Chronic bilateral L3 pars defects with 3 mm anterolisthesis of L3 on L4 and moderate bilateral foraminal stenosis at L3-4 related to disc osteophyte complex and disc uncovering. Mild degenerative arthropathy of both hips. Small umbilical hernia containing adipose tissue. -Pain Control as above   Essential Hypertension: C/w Lisinopril  20 mg po Daily. CTM BP per Protocol. Last BP reading was 122/80   Hyperlipidemia: Continue Atorvastatin  20 mg po Daily  Insomnia: C/w Melatonin 3 mg po at bedtime prn Sleep  Elevated ALT: Mild. In the Setting of  Above. ALT is now 53. CTM & Trend and repeat   Hyperbilirubinemia: Likely Reactive. T Bili is now 1.8 with repeat pending. CTM & Trend and Repeat CMP in the AM  Class I Obesity: Complicates overall prognosis and care. Estimated body mass index is 33.91 kg/m as calculated from the following:   Height as of 06/07/24: 6' (1.829 m).   Weight as of 06/07/24: 113.4 kg. Weight Loss and Dietary Counseling given   DVT prophylaxis: enoxaparin  (LOVENOX ) injection 40 mg Start: 07/25/24 1930    Code Status: Full Code Family Communication: No family present @ bedside  Disposition Plan:  Level of care: Med-Surg Status is: Inpatient Remains inpatient appropriate because: Needs further clinical improvement and tolerance of Diet prior to D/C   Consultants:  None  Procedures:  As delineated as above  Antimicrobials:  Anti-infectives (From admission, onward)    Start     Dose/Rate Route Frequency Ordered Stop   07/25/24 2200  piperacillin -tazobactam (ZOSYN ) IVPB 3.375 g        3.375 g 12.5 mL/hr over 240 Minutes Intravenous Every 8 hours 07/25/24 1931     07/25/24 1515  piperacillin -tazobactam (ZOSYN ) IVPB 3.375 g        3.375 g 100 mL/hr over 30 Minutes Intravenous  Once 07/25/24 1501 07/25/24 1555       Subjective: Seen and examined at bedside and was still having abdominal discomfort.  Had some vomiting earlier.  Denies any chest pain or lightheadedness or dizziness.  No other concerns or complaints at this time.  Objective: Vitals:   07/26/24 0618 07/26/24 0620 07/26/24 1002 07/26/24 1342  BP: 135/68  129/80 122/80  Pulse: 96 96 94 91  Resp: 16  16 18   Temp: 98.9 F (37.2 C)  98.6 F (37 C) 98.7 F (37.1 C)  TempSrc: Oral   Oral  SpO2: 98%  94% 94%    Intake/Output Summary (Last 24 hours) at 07/26/2024 1456 Last data filed at 07/26/2024 1455 Gross per 24 hour  Intake 1919.25 ml  Output --  Net 1919.25 ml   There were no vitals filed for this visit.  Examination: Physical  Exam:  Constitutional: WN/WD, obese Hispanic male who appears overall comfortable Respiratory: Diminished to auscultation bilaterally, no wheezing, rales, rhonchi or crackles. Normal respiratory effort and patient is not tachypenic. No accessory muscle use.  Unlabored breathing Cardiovascular: RRR, no murmurs / rubs / gallops. S1 and S2 auscultated. No extremity edema. Abdomen: Soft, tender to palpate specifically in the left side. Distended secondary body habitus.  Bowel sounds positive.  GU: Deferred. Musculoskeletal: No clubbing / cyanosis of digits/nails. No joint deformity upper and lower extremities.  Skin: No rashes, lesions, ulcers on limited skin evaluation. No induration; Warm and dry.  Neurologic: CN 2-12 grossly intact with no focal deficits. Romberg sign and cerebellar reflexes not assessed.  Psychiatric: Normal judgment and insight. Alert and oriented x 3. Normal mood and appropriate affect.   Data Reviewed: I have personally reviewed following labs and imaging studies  CBC: Recent Labs  Lab 07/25/24 1311 07/26/24 0341  WBC 16.7* 17.5*  NEUTROABS 13.8*  --   HGB 14.9 13.6  HCT 43.5 40.9  MCV 91.0 94.9  PLT 189 164   Basic Metabolic Panel: Recent Labs  Lab 07/25/24 1311 07/26/24 0341  NA 135 137  K 3.9 3.8  CL 99 101  CO2 25 26  GLUCOSE 117* 113*  BUN 12 11  CREATININE 1.02 1.14  CALCIUM  9.3 9.0   GFR: CrCl cannot be calculated (Unknown ideal weight.). Liver Function Tests: Recent Labs  Lab 07/25/24 1311  AST 39  ALT 53*  ALKPHOS 90  BILITOT 1.8*  PROT 7.9  ALBUMIN 4.5   Recent Labs  Lab 07/25/24 1311  LIPASE 18   No results for input(s): AMMONIA in the last 168 hours. Coagulation Profile: No results for input(s): INR, PROTIME in the last 168 hours. Cardiac Enzymes: No results for input(s): CKTOTAL, CKMB, CKMBINDEX, TROPONINI in the last 168 hours. BNP (last 3 results) No results for input(s): PROBNP in the last 8760  hours. HbA1C: No results for input(s): HGBA1C in the last 72 hours. CBG: No results for input(s): GLUCAP in the last 168 hours. Lipid Profile: No results for input(s): CHOL, HDL, LDLCALC, TRIG, CHOLHDL, LDLDIRECT in the last 72 hours. Thyroid  Function Tests: No results for input(s): TSH, T4TOTAL, FREET4, T3FREE, THYROIDAB in the last 72 hours. Anemia Panel: No results for input(s): VITAMINB12, FOLATE, FERRITIN, TIBC, IRON, RETICCTPCT in the last 72 hours. Sepsis Labs: No results for input(s): PROCALCITON, LATICACIDVEN in the last 168 hours.  No results found for this or any previous visit (from the past 240 hours).   Radiology Studies: CT ABDOMEN PELVIS W CONTRAST Result Date: 07/25/2024 EXAM: CT ABDOMEN AND PELVIS WITH CONTRAST 07/25/2024 01:38:46 PM TECHNIQUE: CT of the abdomen and pelvis was performed with the administration of 100 mL of iohexol  (OMNIPAQUE ) 300 MG/ML solution. Multiplanar reformatted images are provided for review. Automated exposure control, iterative reconstruction, and/or weight-based adjustment of the mA/kV was utilized to reduce the radiation dose to as low as reasonably achievable. COMPARISON: 04/01/2019 exam.  CLINICAL HISTORY: Abdominal pain. FINDINGS: LIMITATIONS/ARTIFACTS: Motion artifact is present, reducing diagnostic sensitivity and specificity. LOWER CHEST: No acute abnormality. LIVER: Suspected hepatic steatosis. GALLBLADDER AND BILE DUCTS: Gallbladder is unremarkable. No biliary ductal dilatation. SPLEEN: No acute abnormality. PANCREAS: No acute abnormality. ADRENAL GLANDS: No acute abnormality. KIDNEYS, URETERS AND BLADDER: No stones in the kidneys or ureters. No hydronephrosis. No perinephric or periureteral stranding. Urinary bladder is unremarkable. GI AND BOWEL: Stomach demonstrates no acute abnormality. Prominently inflamed calcified segment of sigmoid colon with marked wall thickening and mild thumbprinting along  with accentuated mucosal enhancement in the involved segment, with striking surrounding inflammatory stranding in the mesocolon. The appearance favors acute diverticulitis, and it involves a segment similar to the 04/01/2019 exam. No discrete abscess or extraluminal gas is seen. Fluid level in the rectum. There is no bowel obstruction. PERITONEUM AND RETROPERITONEUM: Trace amount of free fluid adjacent to the cecum. No free air. VASCULATURE: Aorta is normal in caliber. LYMPH NODES: No lymphadenopathy. REPRODUCTIVE ORGANS: No acute abnormality. BONES AND SOFT TISSUES: Mild degenerative arthropathy of both hips. Chronic bilateral pars defects at L3 with 3 mm of anterolisthesis of L3 on L4, and moderate bilateral foraminal stenosis at L3-L4 due to disc osteophyte complex and disc uncovering. Small umbilical hernia contains adipose tissue. IMPRESSION: 1. Acute sigmoid diverticulitis without abscess or free air. The involved 12 cm segment of the the sigmoid colon is severely inflamed with a notable degree of surrounding inflammatory stranding in the mesocolon. No portal venous gas or pneumatosis. 2. Trace free fluid adjacent to the cecum. 3. Suspected hepatic steatosis. 4. Chronic bilateral L3 pars defects with 3 mm anterolisthesis of L3 on L4 and moderate bilateral foraminal stenosis at L3-4 related to disc osteophyte complex and disc uncovering. 5. Mild degenerative arthropathy of both hips. 6. Small umbilical hernia containing adipose tissue. Electronically signed by: Ryan Salvage MD 07/25/2024 02:37 PM EST RP Workstation: [id number]   Scheduled Meds:  atorvastatin   20 mg Oral Daily   enoxaparin  (LOVENOX ) injection  40 mg Subcutaneous Daily   lisinopril   20 mg Oral Daily   Continuous Infusions:  sodium chloride      piperacillin -tazobactam (ZOSYN )  IV Stopped (07/26/24 0906)    LOS: 1 day   Alejandro Marker, DO [facility] Available via Epic secure chat 7am-7pm After these hours, please refer to  coverage provider listed on [email or web address] 07/26/2024, 2:56 PM  "

## 2024-07-26 NOTE — Telephone Encounter (Unsigned)
 Copied from CRM #[id number]. Topic: General - Other >> Jul 22, 2024  4:32 PM Roselie BROCKS wrote: Reason for CRM: Patient returning call to [facility] concerning paperwork needed by Friday, Transferred to [place]. >> Jul 26, 2024 12:02 PM Hadassah PARAS wrote: Pt called in to speak with w Carlo over insurance claim. Please call pt back on #[phone]

## 2024-07-26 NOTE — Telephone Encounter (Unsigned)
 Copied from CRM #[id number]. Topic: General - Other >> Jul 26, 2024 12:02 PM Hadassah PARAS wrote: Pt called in to speak with w Carlo over insurance claim. Please call pt back on #[phone]

## 2024-07-26 NOTE — Hospital Course (Signed)
 Adrian Hess is a 44 y.o. male with medical history significant for hypertension, hyperlipidemia, insomnia, spinal stenosis and diverticulitis who presented to the ED for evaluation of abdominal pain.  Patient was in his usual state of health until 3 days ago when he started having left lower quadrant pain worse with movements.  Found to have Acute Sigmoid Diverticulitis.  Assessment and Plan:  Acute Sigmoid Diverticulitis: Patient with history of diverticulitis presenting with 3 days of abdominal pain with associated nausea and chills. CT A/P showed Acute sigmoid diverticulitis without abscess or free air. The involved 12 cm segment of the the sigmoid colon is severely inflamed with a notable degree of surrounding inflammatory stranding in the mesocolon. No portal venous gas or pneumatosis. Trace free fluid adjacent to the cecum -Admitted for IV antibiotics and pain control --Pain Control w/ Acetaminophen  650 mg po/RC q6hprn Mild Pain, Hydrocodone -Acetaminophen  1 tab po q6hprn Moderate Pain, and IV Hydromorphone  0.5 mg q6hprn Severe Pain -C/w IVF w/ NS @ 100 mL/hr x 1 Day -C/w Clear Diet and Advance Diet as Tolerated -WBC Trend went from 16.7 -> 17.5.  -Enoxaparin  40 mg sq Daily  -Trend CBC, fever curve  Pars Defects: CT showed Chronic bilateral L3 pars defects with 3 mm anterolisthesis of L3 on L4 and moderate bilateral foraminal stenosis at L3-4 related to disc osteophyte complex and disc uncovering. Mild degenerative arthropathy of both hips. Small umbilical hernia containing adipose tissue. -Pain Control as above   Essential Hypertension: C/w Lisinopril  20 mg po Daily. CTM BP per Protocol. Last BP reading was 122/80   Hyperlipidemia: Continue Atorvastatin  20 mg po Daily  Insomnia: C/w Melatonin 3 mg po at bedtime prn Sleep  Elevated ALT: Mild. In the Setting of Above. ALT is now 53. CTM & Trend and repeat   Hyperbilirubinemia: Likely Reactive. T Bili is now 1.8 with repeat pending.  CTM & Trend and Repeat CMP in the AM  Class I Obesity: Complicates overall prognosis and care. Estimated body mass index is 33.91 kg/m as calculated from the following:   Height as of 06/07/24: 6' (1.829 m).   Weight as of 06/07/24: 113.4 kg. Weight Loss and Dietary Counseling given

## 2024-07-26 NOTE — ED Notes (Signed)
 While I was in another room, this patient's IV pump started beeping. Before I could get back to his room, the patient turned the channel off that was infusing the Zosyn 

## 2024-07-27 DIAGNOSIS — G4700 Insomnia, unspecified: Secondary | ICD-10-CM | POA: Diagnosis not present

## 2024-07-27 DIAGNOSIS — E782 Mixed hyperlipidemia: Secondary | ICD-10-CM | POA: Diagnosis not present

## 2024-07-27 DIAGNOSIS — I10 Essential (primary) hypertension: Secondary | ICD-10-CM | POA: Diagnosis not present

## 2024-07-27 DIAGNOSIS — K5792 Diverticulitis of intestine, part unspecified, without perforation or abscess without bleeding: Secondary | ICD-10-CM | POA: Diagnosis not present

## 2024-07-27 LAB — MISC LABCORP TEST (SEND OUT)
LabCorp test name: 83935
Labcorp test code: 83935

## 2024-07-27 LAB — COMPREHENSIVE METABOLIC PANEL WITH GFR
ALT: 30 U/L (ref 0–44)
AST: 20 U/L (ref 15–41)
Albumin: 3.9 g/dL (ref 3.5–5.0)
Alkaline Phosphatase: 86 U/L (ref 38–126)
Anion gap: 11 (ref 5–15)
BUN: 7 mg/dL (ref 6–20)
CO2: 23 mmol/L (ref 22–32)
Calcium: 8.8 mg/dL — ABNORMAL LOW (ref 8.9–10.3)
Chloride: 102 mmol/L (ref 98–111)
Creatinine, Ser: 0.91 mg/dL (ref 0.61–1.24)
GFR, Estimated: >60 mL/min
Glucose, Bld: 103 mg/dL — ABNORMAL HIGH (ref 70–99)
Potassium: 3.7 mmol/L (ref 3.5–5.1)
Sodium: 135 mmol/L (ref 135–145)
Total Bilirubin: 1.5 mg/dL — ABNORMAL HIGH (ref 0.0–1.2)
Total Protein: 7.1 g/dL (ref 6.5–8.1)

## 2024-07-27 LAB — MAGNESIUM: Magnesium: 2.7 mg/dL — ABNORMAL HIGH (ref 1.7–2.4)

## 2024-07-27 LAB — CBC WITH DIFFERENTIAL/PLATELET
Abs Immature Granulocytes: 0.07 10*3/uL (ref 0.00–0.07)
Basophils Absolute: 0 10*3/uL (ref 0.0–0.1)
Basophils Relative: 0 %
Eosinophils Absolute: 0.3 10*3/uL (ref 0.0–0.5)
Eosinophils Relative: 2 %
HCT: 39.2 % (ref 39.0–52.0)
Hemoglobin: 12.7 g/dL — ABNORMAL LOW (ref 13.0–17.0)
Immature Granulocytes: 1 %
Lymphocytes Relative: 10 %
Lymphs Abs: 1.5 10*3/uL (ref 0.7–4.0)
MCH: 30.8 pg (ref 26.0–34.0)
MCHC: 32.4 g/dL (ref 30.0–36.0)
MCV: 94.9 fL (ref 80.0–100.0)
Monocytes Absolute: 0.9 10*3/uL (ref 0.1–1.0)
Monocytes Relative: 6 %
Neutro Abs: 12.4 10*3/uL — ABNORMAL HIGH (ref 1.7–7.7)
Neutrophils Relative %: 81 %
Platelets: 150 10*3/uL (ref 150–400)
RBC: 4.13 MIL/uL — ABNORMAL LOW (ref 4.22–5.81)
RDW: 12.8 % (ref 11.5–15.5)
WBC: 15.1 10*3/uL — ABNORMAL HIGH (ref 4.0–10.5)
nRBC: 0 % (ref 0.0–0.2)

## 2024-07-27 LAB — PHOSPHORUS: Phosphorus: 2.6 mg/dL (ref 2.5–4.6)

## 2024-07-27 NOTE — Plan of Care (Signed)
   Problem: Education: Goal: Knowledge of General Education information will improve Description: Including pain rating scale, medication(s)/side effects and non-pharmacologic comfort measures Outcome: Progressing   Problem: Clinical Measurements: Goal: Ability to maintain clinical measurements within normal limits will improve Outcome: Progressing   Problem: Nutrition: Goal: Adequate nutrition will be maintained Outcome: Progressing

## 2024-07-27 NOTE — Progress Notes (Signed)
 " PROGRESS NOTE    Adrian Hess  FMW:[id number] DOB: 30-Sep-1980 DOA: 07/25/2024 PCP: [name], MD   Brief Narrative:  Adrian Hess is a 44 y.o. male with medical history significant for hypertension, hyperlipidemia, insomnia, spinal stenosis and diverticulitis who presented to the ED for evaluation of abdominal pain.  Patient was in his usual state of health until 3 days ago when he started having left lower quadrant pain worse with movements.  Found to have Acute Sigmoid Diverticulitis and is now improving. Diet is being advanced and if tolerated without issues likely can be D/C'd on 07/28/24.   Assessment and Plan:  Acute Sigmoid Diverticulitis: Patient with history of diverticulitis presenting with 3 days of abdominal pain with associated nausea and chills. CT A/P showed Acute sigmoid diverticulitis without abscess or free air. The involved 12 cm segment of the the sigmoid colon is severely inflamed with a notable degree of surrounding inflammatory stranding in the mesocolon. No portal venous gas or pneumatosis. Trace free fluid adjacent to the cecum -Admitted for IV antibiotics and pain control; C/w IV Zosyn  q8h -Pain Control w/ Acetaminophen  650 mg po/RC q6hprn Mild Pain, Hydrocodone -Acetaminophen  1 tab po q6hprn Moderate Pain, and IV Hydromorphone  0.5 mg q6hprn Severe Pain -IVF w/ NS @ 100 mL/hr x 1 Day to end today  -Tolerated CLD so will go to FULL this AM and Advance Diet as Tolerated and go to SOFT Diet this evening; If able to Tolerate SOFT without issues and pain is controlled, likely can D/C on 07/28/24 -WBC Trend went from 16.7 -> 17.5 -> 17.6 -> 15.1.  -Enoxaparin  40 mg sq Daily  -Trend CBC, fever curve  Pars Defects: CT showed Chronic bilateral L3 pars defects with 3 mm anterolisthesis of L3 on L4 and moderate bilateral foraminal stenosis at L3-4 related to disc osteophyte complex and disc uncovering. Mild degenerative arthropathy of both hips. Small umbilical hernia containing  adipose tissue. -Pain Control as above   Essential Hypertension: C/w Lisinopril  20 mg po Daily. CTM BP per Protocol. Last BP reading was 122/80   Hyperlipidemia: Continue Atorvastatin  20 mg po Daily  Insomnia: C/w Melatonin 3 mg po at bedtime prn Sleep  Elevated ALT: Mild and improved. In the Setting of Above. ALT is now 30. CTM & Trend and repeat CMP in the AM  Normocytic Anemia: Hgb/Hct went from 13.6/40.9 -> 13.4/41.0 -> 12.7/39.2. Check Anemia Panel in the AM. CTM  Hyperbilirubinemia: Likely Reactive. T Bili is now 1.8 -> 1.9 -> 1.5. CTM & Trend and Repeat CMP in the AM  Class I Obesity: Complicates overall prognosis and care. Estimated body mass index is 33.91 kg/m as calculated from the following:   Height as of 06/07/24: 6' (1.829 m).   Weight as of 06/07/24: 113.4 kg. Weight Loss and Dietary Counseling given   DVT prophylaxis: enoxaparin  (LOVENOX ) injection 40 mg Start: 07/25/24 1930    Code Status: Full Code Family Communication: No family present @ bedside   Disposition Plan:  Level of care: Med-Surg Status is: Inpatient Remains inpatient appropriate because: He is slowly improving and continues to tolerate diet with no issues and anticipate discharge in the next 24 hours  Consultants:  None  Procedures:  As delineated as above  Antimicrobials:  Anti-infectives (From admission, onward)    Start     Dose/Rate Route Frequency Ordered Stop   07/25/24 2200  piperacillin -tazobactam (ZOSYN ) IVPB 3.375 g        3.375 g 12.5 mL/hr over 240 Minutes  Intravenous Every 8 hours 07/25/24 1931     07/25/24 1515  piperacillin -tazobactam (ZOSYN ) IVPB 3.375 g        3.375 g 100 mL/hr over 30 Minutes Intravenous  Once 07/25/24 1501 07/25/24 1555       Subjective: Seen and examined at bedside and thinks he is doing better but still having some abdominal discomfort.  Denies chest pain or shortness of breath.  States that he he feels better and tolerated his clear liquid diet  without issues.   Objective: Vitals:   07/26/24 1002 07/26/24 1342 07/27/24 0538 07/27/24 0947  BP: 129/80 122/80 138/86 132/78  Pulse: 94 91 87 89  Resp: 16 18 20    Temp: 98.6 F (37 C) 98.7 F (37.1 C) 98.7 F (37.1 C) 99 F (37.2 C)  TempSrc:  Oral  Oral  SpO2: 94% 94% 96% 98%    Intake/Output Summary (Last 24 hours) at 07/27/2024 1303 Last data filed at 07/27/2024 0539 Gross per 24 hour  Intake 2869.24 ml  Output --  Net 2869.24 ml   There were no vitals filed for this visit.  Examination: Physical Exam:  Constitutional: WN/WD obese male in no acute distress Respiratory: Diminished to auscultation bilaterally, no wheezing, rales, rhonchi or crackles. Normal respiratory effort and patient is not tachypenic. No accessory muscle use.  Unlabored breathing Cardiovascular: RRR, no murmurs / rubs / gallops. S1 and S2 auscultated. No extremity edema.  Abdomen: Soft, a little-tender, distended secondary body habitus.. Bowel sounds positive.  GU: Deferred. Musculoskeletal: No clubbing / cyanosis of digits/nails. No joint deformity upper and lower extremities.  Skin: No rashes, lesions, ulcers limited on a skin evaluation. No induration; Warm and dry.  Neurologic: CN 2-12 grossly intact with no focal deficits. Romberg sign cerebellar reflexes not assessed.  Psychiatric: Normal judgment and insight. Alert and oriented x 3. Normal mood and appropriate affect.   Data Reviewed: I have personally reviewed following labs and imaging studies  CBC: Recent Labs  Lab 07/25/24 1311 07/26/24 0341 07/26/24 1902 07/27/24 0546  WBC 16.7* 17.5* 17.6* 15.1*  NEUTROABS 13.8*  --  15.3* 12.4*  HGB 14.9 13.6 13.4 12.7*  HCT 43.5 40.9 41.0 39.2  MCV 91.0 94.9 94.0 94.9  PLT 189 164 176 150   Basic Metabolic Panel: Recent Labs  Lab 07/25/24 1311 07/26/24 0341 07/26/24 1902 07/27/24 0546  NA 135 137  --  135  K 3.9 3.8  --  3.7  CL 99 101  --  102  CO2 25 26  --  23  GLUCOSE 117*  113*  --  103*  BUN 12 11  --  7  CREATININE 1.02 1.14  --  0.91  CALCIUM  9.3 9.0  --  8.8*  MG  --   --  2.1 2.7*  PHOS  --   --  1.8* 2.6   GFR: CrCl cannot be calculated (Unknown ideal weight.). Liver Function Tests: Recent Labs  Lab 07/25/24 1311 07/26/24 1902 07/27/24 0546  AST 39 23 20  ALT 53* 36 30  ALKPHOS 90 81 86  BILITOT 1.8* 1.9* 1.5*  PROT 7.9 7.6 7.1  ALBUMIN 4.5 4.2 3.9   Recent Labs  Lab 07/25/24 1311  LIPASE 18   No results for input(s): AMMONIA in the last 168 hours. Coagulation Profile: No results for input(s): INR, PROTIME in the last 168 hours. Cardiac Enzymes: No results for input(s): CKTOTAL, CKMB, CKMBINDEX, TROPONINI in the last 168 hours. BNP (last 3 results) No results for  input(s): PROBNP in the last 8760 hours. HbA1C: No results for input(s): HGBA1C in the last 72 hours. CBG: No results for input(s): GLUCAP in the last 168 hours. Lipid Profile: No results for input(s): CHOL, HDL, LDLCALC, TRIG, CHOLHDL, LDLDIRECT in the last 72 hours. Thyroid  Function Tests: No results for input(s): TSH, T4TOTAL, FREET4, T3FREE, THYROIDAB in the last 72 hours. Anemia Panel: No results for input(s): VITAMINB12, FOLATE, FERRITIN, TIBC, IRON, RETICCTPCT in the last 72 hours. Sepsis Labs: No results for input(s): PROCALCITON, LATICACIDVEN in the last 168 hours.  No results found for this or any previous visit (from the past 240 hours).   Radiology Studies: CT ABDOMEN PELVIS W CONTRAST Result Date: 07/25/2024 EXAM: CT ABDOMEN AND PELVIS WITH CONTRAST 07/25/2024 01:38:46 PM TECHNIQUE: CT of the abdomen and pelvis was performed with the administration of 100 mL of iohexol  (OMNIPAQUE ) 300 MG/ML solution. Multiplanar reformatted images are provided for review. Automated exposure control, iterative reconstruction, and/or weight-based adjustment of the mA/kV was utilized to reduce the radiation dose to as  low as reasonably achievable. COMPARISON: 04/01/2019 exam. CLINICAL HISTORY: Abdominal pain. FINDINGS: LIMITATIONS/ARTIFACTS: Motion artifact is present, reducing diagnostic sensitivity and specificity. LOWER CHEST: No acute abnormality. LIVER: Suspected hepatic steatosis. GALLBLADDER AND BILE DUCTS: Gallbladder is unremarkable. No biliary ductal dilatation. SPLEEN: No acute abnormality. PANCREAS: No acute abnormality. ADRENAL GLANDS: No acute abnormality. KIDNEYS, URETERS AND BLADDER: No stones in the kidneys or ureters. No hydronephrosis. No perinephric or periureteral stranding. Urinary bladder is unremarkable. GI AND BOWEL: Stomach demonstrates no acute abnormality. Prominently inflamed calcified segment of sigmoid colon with marked wall thickening and mild thumbprinting along with accentuated mucosal enhancement in the involved segment, with striking surrounding inflammatory stranding in the mesocolon. The appearance favors acute diverticulitis, and it involves a segment similar to the 04/01/2019 exam. No discrete abscess or extraluminal gas is seen. Fluid level in the rectum. There is no bowel obstruction. PERITONEUM AND RETROPERITONEUM: Trace amount of free fluid adjacent to the cecum. No free air. VASCULATURE: Aorta is normal in caliber. LYMPH NODES: No lymphadenopathy. REPRODUCTIVE ORGANS: No acute abnormality. BONES AND SOFT TISSUES: Mild degenerative arthropathy of both hips. Chronic bilateral pars defects at L3 with 3 mm of anterolisthesis of L3 on L4, and moderate bilateral foraminal stenosis at L3-L4 due to disc osteophyte complex and disc uncovering. Small umbilical hernia contains adipose tissue. IMPRESSION: 1. Acute sigmoid diverticulitis without abscess or free air. The involved 12 cm segment of the the sigmoid colon is severely inflamed with a notable degree of surrounding inflammatory stranding in the mesocolon. No portal venous gas or pneumatosis. 2. Trace free fluid adjacent to the cecum. 3.  Suspected hepatic steatosis. 4. Chronic bilateral L3 pars defects with 3 mm anterolisthesis of L3 on L4 and moderate bilateral foraminal stenosis at L3-4 related to disc osteophyte complex and disc uncovering. 5. Mild degenerative arthropathy of both hips. 6. Small umbilical hernia containing adipose tissue. Electronically signed by: Ryan Salvage MD 07/25/2024 02:37 PM EST RP Workstation: [id number]   Scheduled Meds:  atorvastatin   20 mg Oral Daily   enoxaparin  (LOVENOX ) injection  40 mg Subcutaneous Daily   lisinopril   20 mg Oral Daily   Continuous Infusions:  sodium chloride  100 mL/hr at 07/27/24 9081   piperacillin -tazobactam (ZOSYN )  IV 3.375 g (07/27/24 0539)    LOS: 2 days   Alejandro Marker, DO [facility] Available via Epic secure chat 7am-7pm After these hours, please refer to coverage provider listed on [email or web address] 07/27/2024, 1:03 PM  "

## 2024-07-27 NOTE — Telephone Encounter (Signed)
 Please review and advise.

## 2024-07-27 NOTE — Telephone Encounter (Signed)
 Called patient, he states he was out of work for 2 1/2 months.  He said nobody took me out of work.  He states when he came in to for his CPE in 11/7 he told Dr.Jha about his foot.  He said Dr. Vita sent him to an urgent care and then to a foot Dr.  He states UC cut his foot with a knife and the infection spread to his foot.  He took antibiotics for 7 - 10 days.  Patient said Dr. Vita is not going to lose his job, why can't he take me out of work.  I am self employed.  He states we, not me, he said makes him feel like he is a CONARTIST.  I explained I will talk with Dr. Vita and get back to him. Pt began telling me that something could happen to me or my family and I would be out of work and continued on.  I told him I would get back with him.  I offered for him to see Dr. Joyce

## 2024-07-27 NOTE — Progress Notes (Signed)
" °   07/27/24 1640  TOC Brief Assessment  Insurance and Status Reviewed  Patient has primary care physician Yes  Home environment has been reviewed single family home  Prior level of function: independent  Prior/Current Home Services No current home services  Social Drivers of Health Review SDOH reviewed no interventions necessary  Readmission risk has been reviewed Yes  Transition of care needs no transition of care needs at this time    Signed: Heather Saltness, MSW, LCSW Clinical Social Worker Inpatient Care Management 07/27/2024 4:41 PM   "

## 2024-07-28 ENCOUNTER — Encounter (HOSPITAL_COMMUNITY): Payer: Self-pay | Admitting: Student

## 2024-07-28 DIAGNOSIS — Z716 Tobacco abuse counseling: Secondary | ICD-10-CM | POA: Diagnosis not present

## 2024-07-28 DIAGNOSIS — Z7141 Alcohol abuse counseling and surveillance of alcoholic: Secondary | ICD-10-CM | POA: Diagnosis not present

## 2024-07-28 DIAGNOSIS — K5792 Diverticulitis of intestine, part unspecified, without perforation or abscess without bleeding: Secondary | ICD-10-CM | POA: Diagnosis not present

## 2024-07-28 LAB — CBC WITH DIFFERENTIAL/PLATELET
Abs Immature Granulocytes: 0.04 10*3/uL (ref 0.00–0.07)
Basophils Absolute: 0 10*3/uL (ref 0.0–0.1)
Basophils Relative: 0 %
Eosinophils Absolute: 0.3 10*3/uL (ref 0.0–0.5)
Eosinophils Relative: 4 %
HCT: 38.5 % — ABNORMAL LOW (ref 39.0–52.0)
Hemoglobin: 13 g/dL (ref 13.0–17.0)
Immature Granulocytes: 1 %
Lymphocytes Relative: 16 %
Lymphs Abs: 1.4 10*3/uL (ref 0.7–4.0)
MCH: 30.9 pg (ref 26.0–34.0)
MCHC: 33.8 g/dL (ref 30.0–36.0)
MCV: 91.4 fL (ref 80.0–100.0)
Monocytes Absolute: 0.6 10*3/uL (ref 0.1–1.0)
Monocytes Relative: 7 %
Neutro Abs: 6.3 10*3/uL (ref 1.7–7.7)
Neutrophils Relative %: 72 %
Platelets: 168 10*3/uL (ref 150–400)
RBC: 4.21 MIL/uL — ABNORMAL LOW (ref 4.22–5.81)
RDW: 12.5 % (ref 11.5–15.5)
WBC: 8.7 10*3/uL (ref 4.0–10.5)
nRBC: 0 % (ref 0.0–0.2)

## 2024-07-28 LAB — COMPREHENSIVE METABOLIC PANEL WITH GFR
ALT: 32 U/L (ref 0–44)
AST: 22 U/L (ref 15–41)
Albumin: 3.9 g/dL (ref 3.5–5.0)
Alkaline Phosphatase: 112 U/L (ref 38–126)
Anion gap: 10 (ref 5–15)
BUN: 7 mg/dL (ref 6–20)
CO2: 25 mmol/L (ref 22–32)
Calcium: 9.1 mg/dL (ref 8.9–10.3)
Chloride: 103 mmol/L (ref 98–111)
Creatinine, Ser: 0.84 mg/dL (ref 0.61–1.24)
GFR, Estimated: >60 mL/min
Glucose, Bld: 93 mg/dL (ref 70–99)
Potassium: 3.8 mmol/L (ref 3.5–5.1)
Sodium: 138 mmol/L (ref 135–145)
Total Bilirubin: 0.9 mg/dL (ref 0.0–1.2)
Total Protein: 7.3 g/dL (ref 6.5–8.1)

## 2024-07-28 LAB — MAGNESIUM: Magnesium: 2.7 mg/dL — ABNORMAL HIGH (ref 1.7–2.4)

## 2024-07-28 LAB — IRON AND TIBC
Iron: 30 ug/dL — ABNORMAL LOW (ref 45–182)
Saturation Ratios: 11 % — ABNORMAL LOW (ref 17.9–39.5)
TIBC: 266 ug/dL (ref 250–450)
UIBC: 236 ug/dL

## 2024-07-28 LAB — RETICULOCYTES
Immature Retic Fract: 10.3 % (ref 2.3–15.9)
RBC.: 4.11 MIL/uL — ABNORMAL LOW (ref 4.22–5.81)
Retic Count, Absolute: 94.9 10*3/uL (ref 19.0–186.0)
Retic Ct Pct: 2.3 % (ref 0.4–3.1)

## 2024-07-28 LAB — FOLATE: Folate: 9.4 ng/mL

## 2024-07-28 LAB — VITAMIN B12: Vitamin B-12: 980 pg/mL — ABNORMAL HIGH (ref 180–914)

## 2024-07-28 LAB — PHOSPHORUS: Phosphorus: 3.2 mg/dL (ref 2.5–4.6)

## 2024-07-28 LAB — FERRITIN: Ferritin: 816 ng/mL — ABNORMAL HIGH (ref 24–336)

## 2024-07-28 MED ORDER — POLYETHYLENE GLYCOL 3350 17 G PO PACK
17.0000 g | PACK | Freq: Every day | ORAL | Status: DC
  Administered 2024-07-28 – 2024-07-29 (×2): 17 g via ORAL
  Filled 2024-07-28 (×2): qty 1

## 2024-07-28 NOTE — Plan of Care (Signed)
  Problem: Health Behavior/Discharge Planning: Goal: Ability to manage health-related needs will improve Outcome: Progressing   Problem: Nutrition: Goal: Adequate nutrition will be maintained Outcome: Progressing   Problem: Pain Managment: Goal: General experience of comfort will improve and/or be controlled Outcome: Progressing

## 2024-07-28 NOTE — Progress Notes (Addendum)
 Nutrition Note  RD consulted for nutrition education regarding Nutrition Management for Diverticulosis/Diverticulitis.  RD provided both Low Fiber Nutrition Therapy handout from the Academy of [employer]. Reviewed home diet with pt and suggested ways to meet nutrition goals over the next several weeks. Explained reasons to follow Low Fiber diet for the next 2 weeks and discussed ways to achieve. Discussed best practice for long-term management of diverticulosis is a High Fiber diet and discussed ways to increase fiber content in an appropriate time frame. Encouraged fresh fruits and vegetables as well as whole grain sources of carbohydrates to maximize fiber intake. Encouraged fluid intake. Discussed symptom management should abdominal pain occur while on high fiber diet.  Pt verbalizes understanding of information provided. Expect good compliance.  Current diet order is Soft diet, just ordered lunch. Labs and medications reviewed. No further nutrition interventions warranted at this time. If additional nutrition issues arise, please re-consult RD.  Morna Lee, MS, RD, LDN Inpatient Clinical Dietitian Contact via Secure chat

## 2024-07-28 NOTE — Progress Notes (Signed)
 " [facility] Progress Note  Patient: Adrian Hess     FMW:[id number]  DOA: 07/25/2024   PCP: [name], MD       Brief hospital course: This is a 44 year old male with history of diverticulitis, hypertension, hyperlipidemia, insomnia and obesity who presents to the hospital for 3 days of left lower quadrant pain with constipation and mucus-like stools.  He stated he had a bowel movement after taking a laxative.  He also stated that the urine became red on the day that he came to the hospital. In the ED: WBC count 16.7 Tender in left lower quadrant CT scan: Acute sigmoid diverticulitis without abscess or free air involving 12 cm of the sigmoid colon which is noted to be severely inflamed with surrounding inflammatory stranding in the mesocolon.  Incidental findings were trace free fluid adjacent to the cecum, hepatic steatosis, chronic bilateral L3 pars defect, moderate bilateral foraminal stenosis at L3-L4 related to disc osteophyte complex, mild degenerative arthropathy of both hips and a small umbilical hernia with adipose tissue. Treated with morphine  and Dilaudid  and continued to have uncontrolled pain.  Given Zofran  but continued to have nausea.  Started on Zosyn .  WBC count steadily improved and diet advanced however patient was not actually taking the solid food that was being brought to him.  Subjective:  Patient tells me that he has been taking a liquid diet and had some grits.  He has been avoiding milk and solid food as he is concerned about having increased abdominal pain.  He states he wakes up feeling sore and he has been trying to do stretches to help reduce the pain.  No complaints of nausea but he remains constipated.  He continues to have pain and continues to take pain medication.  Assessment and Plan: Principal Problem:   Acute diverticulitis - This is his second bout of diverticulitis, the first being about 6 years ago - Although leukocytosis has improved, he  is still avoiding solids and he continues to have a significant amount of pain - We have discussed taking low fiber food & reducing the dose of pain medication that he is taking-he is taking morphine  and hydrocodone  - I have discussed that he will need a colonoscopy in the near future which he is agreeable to-have consulted [facility]  Active Problems: Fatty liver, alcohol use and elevated LFTs - Discussed with patient - He admits to drinking liquor at least twice a week and states that he will attempt to cut back - Discussed need for weight loss as well - LFTs improved to normal   Anemia - Hemoglobin 12.7 yesterday but has increased to 13 today-baseline appears to be 15-17 - Anemia panel reveals low iron levels with iron saturation being 11, normal iron binding capacity and normal folate and B12    Essential hypertension - Continue lisinopril     Mixed hyperlipidemia - Continue statin    Insomnia - Continue melatonin  Overweight - have asked for a weight today      Code Status: Full Code Total time on patient care: 40 min DVT prophylaxis:  enoxaparin  (LOVENOX ) injection 40 mg Start: 07/25/24 1930     Objective:   Vitals:   07/28/24 0504 07/28/24 0940 07/28/24 1214 07/28/24 1440  BP: (!) 141/96 (!) 141/96  118/76  Pulse: 81  86 72  Resp: 18  18   Temp: 98.5 F (36.9 C)  98.8 F (37.1 C)   TempSrc: Oral  Oral   SpO2: 97%  100%    There were no vitals filed for this visit. Exam: General exam: Appears comfortable  HEENT: oral mucosa moist Respiratory system: Clear to auscultation.  Cardiovascular system: S1 & S2 heard  Gastrointestinal system: Abdomen soft, LLQ tenderness, non distended. Normal bowel sounds   Extremities: No cyanosis, clubbing or edema Psychiatry:  Mood & affect appropriate.      CBC: Recent Labs  Lab 07/25/24 1311 07/26/24 0341 07/26/24 1902 07/27/24 0546 07/28/24 0608  WBC 16.7* 17.5* 17.6* 15.1* 8.7  NEUTROABS 13.8*  --  15.3* 12.4*  6.3  HGB 14.9 13.6 13.4 12.7* 13.0  HCT 43.5 40.9 41.0 39.2 38.5*  MCV 91.0 94.9 94.0 94.9 91.4  PLT 189 164 176 150 168   Basic Metabolic Panel: Recent Labs  Lab 07/25/24 1311 07/26/24 0341 07/26/24 1902 07/27/24 0546 07/28/24 0608  NA 135 137  --  135 138  K 3.9 3.8  --  3.7 3.8  CL 99 101  --  102 103  CO2 25 26  --  23 25  GLUCOSE 117* 113*  --  103* 93  BUN 12 11  --  7 7  CREATININE 1.02 1.14  --  0.91 0.84  CALCIUM  9.3 9.0  --  8.8* 9.1  MG  --   --  2.1 2.7* 2.7*  PHOS  --   --  1.8* 2.6 3.2     Scheduled Meds:  atorvastatin   20 mg Oral Daily   enoxaparin  (LOVENOX ) injection  40 mg Subcutaneous Daily   lisinopril   20 mg Oral Daily   polyethylene glycol  17 g Oral Daily    Imaging and lab data personally reviewed   Author: Johnay Mano  07/28/2024 4:27 PM  To contact [facility]>   Check the care team in [facility] and look for the attending/consulting [facility] provider listed  Log into [email or web address] and use [place]'s universal password   Go to> [facility]  and find provider  If you still have difficulty reaching the provider, please page the [facility] (Director on Call) for the Hospitalists listed on amion     "

## 2024-07-29 ENCOUNTER — Other Ambulatory Visit (HOSPITAL_COMMUNITY): Payer: Self-pay

## 2024-07-29 DIAGNOSIS — Z7141 Alcohol abuse counseling and surveillance of alcoholic: Secondary | ICD-10-CM | POA: Diagnosis not present

## 2024-07-29 DIAGNOSIS — K5792 Diverticulitis of intestine, part unspecified, without perforation or abscess without bleeding: Secondary | ICD-10-CM | POA: Diagnosis not present

## 2024-07-29 DIAGNOSIS — K760 Fatty (change of) liver, not elsewhere classified: Secondary | ICD-10-CM | POA: Insufficient documentation

## 2024-07-29 DIAGNOSIS — Z716 Tobacco abuse counseling: Secondary | ICD-10-CM

## 2024-07-29 MED ORDER — AMOXICILLIN-POT CLAVULANATE 875-125 MG PO TABS
1.0000 | ORAL_TABLET | Freq: Two times a day (BID) | ORAL | 0 refills | Status: AC
  Filled 2024-07-29: qty 20, 10d supply, fill #0

## 2024-07-29 MED ORDER — POLYETHYLENE GLYCOL 3350 17 GM/SCOOP PO POWD
17.0000 g | Freq: Every day | ORAL | 0 refills | Status: AC
  Filled 2024-07-29: qty 238, 14d supply, fill #0

## 2024-07-29 MED ORDER — ONDANSETRON HCL 4 MG PO TABS
4.0000 mg | ORAL_TABLET | Freq: Four times a day (QID) | ORAL | 0 refills | Status: AC | PRN
  Filled 2024-07-29: qty 20, 5d supply, fill #0

## 2024-07-29 MED ORDER — SENNOSIDES-DOCUSATE SODIUM 8.6-50 MG PO TABS
1.0000 | ORAL_TABLET | Freq: Every evening | ORAL | 0 refills | Status: AC | PRN
  Filled 2024-07-29: qty 30, 30d supply, fill #0

## 2024-07-29 NOTE — Telephone Encounter (Signed)
 Called pt & informed paperwork completed & ready to pick up, he states he is still in hospital & will have wife pick up

## 2024-07-29 NOTE — Discharge Summary (Signed)
 Physician Discharge Summary  Adrian Hess FMW:[id number] DOB: 1980-12-11 DOA: 07/25/2024  PCP: Adrian Morrow, MD  Admit date: 07/25/2024 Discharge date: 07/29/2024   Recommendations for Outpatient Follow-up:  Outpatient colonoscopy in 6-8 wks  Consults:  GI Procedures:  none   Discharge Diagnoses:   Principal Problem:   Acute diverticulitis Active Problems:   Essential hypertension   Tobacco use   Mixed hyperlipidemia   Insomnia   Fatty liver     Brief hospital course: This is a 44 year old male with history of diverticulitis, hypertension, hyperlipidemia, insomnia and obesity who presents to the hospital for 3 days of left lower quadrant pain with constipation and mucus-like stools.  He stated he had a bowel movement after taking a laxative.  He also stated that the urine became red on the day that he came to the hospital. In the ED: WBC count 16.7 Tender in left lower quadrant CT scan: Acute sigmoid diverticulitis without abscess or free air involving 12 cm of the sigmoid colon which is noted to be severely inflamed with surrounding inflammatory stranding in the mesocolon.  Incidental findings were trace free fluid adjacent to the cecum, hepatic steatosis, chronic bilateral L3 pars defect, moderate bilateral foraminal stenosis at L3-L4 related to disc osteophyte complex, mild degenerative arthropathy of both hips and a small umbilical hernia with adipose tissue. He also noted that he had multiple loose stools after the pain started.  Treated with morphine  and Dilaudid  and continued to have uncontrolled pain.  Given Zofran  but continued to have nausea.  Started on Zosyn .      Assessment and Plan: Principal Problem:   Acute diverticulitis - This is his second bout of diverticulitis, the first being about 6 years ago - Leukocytosis resolved, tolerating solids, pain controlled on oral pain medications - extensive diverticulitis on CT- thankfully no perforation or abscess- will need  total 14 days of antibiotics - I discussed the necessity to have a close follow up to ensure resolution of current infection - I have discussed that he will need a colonoscopy which he is agreeable to- GI consulted to help reinforce to patient the need for a colonoscopy and to help arrange an outpatient colonoscopy   Active Problems: Fatty liver, alcohol use and elevated LFTs - Discussed with patient - He admits to drinking liquor at least twice a week and states that he will attempt to cut back - Discussed need for weight loss as well - LFTs improved to normal   Chronic constipation - Miralax  and Senna prescribed - dietary changes discussed and nutrition consulted to further discuss the need for more fiber in his diet   Anemia - Hemoglobin 12.7 yesterday but has increased to 13 -baseline appears to be 15-17 - Anemia panel reveals low iron levels with iron saturation being 11, normal iron binding capacity and normal folate and B12     Essential hypertension - Continue lisinopril      Mixed hyperlipidemia - Continue statin     Insomnia - Continue melatonin  Tobacco abuse - counseled to avoid tobacco products  Obesity class 1 Body mass index is 34.11 kg/m.         Discharge Instructions   Allergies as of 07/29/2024       Reactions   Ciprofloxacin     Joint Pain   Percocet [oxycodone -acetaminophen ] Hypertension   irriatable        Medication List     STOP taking these medications    cephALEXin  500 MG capsule Commonly known as:  KEFLEX        TAKE these medications    acetaminophen  325 MG tablet Commonly known as: TYLENOL  Take 2 tablets (650 mg total) by mouth every 6 (six) hours as needed for mild pain (or Fever >/= 101).   amoxicillin -clavulanate 875-125 MG tablet Commonly known as: AUGMENTIN  Take 1 tablet by mouth 2 (two) times daily for 10 days. Notes to patient: Take with food   atorvastatin  20 MG tablet Commonly known as: LIPITOR Take 1 tablet  (20 mg total) by mouth daily.   cetirizine 10 MG tablet Commonly known as: ZYRTEC Take 10 mg by mouth daily.   HYDROcodone -acetaminophen  5-325 MG tablet Commonly known as: NORCO/VICODIN Take 1 tablet by mouth every 8 (eight) hours as needed.   lisinopril  20 MG tablet Commonly known as: ZESTRIL  Take 1 tablet (20 mg total) by mouth daily.   ondansetron  4 MG tablet Commonly known as: ZOFRAN  Take 1 tablet (4 mg total) by mouth every 6 (six) hours as needed for nausea.   polyethylene glycol powder 17 GM/SCOOP powder Commonly known as: GLYCOLAX /MIRALAX  Take 17 g by mouth daily. Dissolve 1 capful (17g) in 4-8 ounces of liquid and take by mouth daily. Start taking on: July 30, 2024   Stool Softener/Laxative 50-8.6 MG tablet Generic drug: senna-docusate Take 1 tablet by mouth at bedtime as needed for mild constipation.        Follow-up Information     Adrian Morrow, MD. Schedule an appointment as soon as possible for a visit.   Specialty: Family Medicine Why: please f/u in 1 wk Contact information: [address] [place] [address] [phone]         Adrian Claw, MD. Schedule an appointment as soon as possible for a visit in 6 week(s).   Specialty: Gastroenterology Why: Follow-up on diverticulitis and to schedule outpatient colonoscopy Contact information: [address] [place] [address] [phone]                    The results of significant diagnostics from this hospitalization (including imaging, microbiology, ancillary and laboratory) are listed below for reference.    CT ABDOMEN PELVIS W CONTRAST Result Date: 07/25/2024 EXAM: CT ABDOMEN AND PELVIS WITH CONTRAST 07/25/2024 01:38:46 PM TECHNIQUE: CT of the abdomen and pelvis was performed with the administration of 100 mL of iohexol  (OMNIPAQUE ) 300 MG/ML solution. Multiplanar reformatted images are provided for review. Automated exposure control, iterative  reconstruction, and/or weight-based adjustment of the mA/kV was utilized to reduce the radiation dose to as low as reasonably achievable. COMPARISON: 04/01/2019 exam. CLINICAL HISTORY: Abdominal pain. FINDINGS: LIMITATIONS/ARTIFACTS: Motion artifact is present, reducing diagnostic sensitivity and specificity. LOWER CHEST: No acute abnormality. LIVER: Suspected hepatic steatosis. GALLBLADDER AND BILE DUCTS: Gallbladder is unremarkable. No biliary ductal dilatation. SPLEEN: No acute abnormality. PANCREAS: No acute abnormality. ADRENAL GLANDS: No acute abnormality. KIDNEYS, URETERS AND BLADDER: No stones in the kidneys or ureters. No hydronephrosis. No perinephric or periureteral stranding. Urinary bladder is unremarkable. GI AND BOWEL: Stomach demonstrates no acute abnormality. Prominently inflamed calcified segment of sigmoid colon with marked wall thickening and mild thumbprinting along with accentuated mucosal enhancement in the involved segment, with striking surrounding inflammatory stranding in the mesocolon. The appearance favors acute diverticulitis, and it involves a segment similar to the 04/01/2019 exam. No discrete abscess or extraluminal gas is seen. Fluid level in the rectum. There is no bowel obstruction. PERITONEUM AND RETROPERITONEUM: Trace amount of free fluid adjacent to the cecum. No free air. VASCULATURE:  Aorta is normal in caliber. LYMPH NODES: No lymphadenopathy. REPRODUCTIVE ORGANS: No acute abnormality. BONES AND SOFT TISSUES: Mild degenerative arthropathy of both hips. Chronic bilateral pars defects at L3 with 3 mm of anterolisthesis of L3 on L4, and moderate bilateral foraminal stenosis at L3-L4 due to disc osteophyte complex and disc uncovering. Small umbilical hernia contains adipose tissue. IMPRESSION: 1. Acute sigmoid diverticulitis without abscess or free air. The involved 12 cm segment of the the sigmoid colon is severely inflamed with a notable degree of surrounding inflammatory  stranding in the mesocolon. No portal venous gas or pneumatosis. 2. Trace free fluid adjacent to the cecum. 3. Suspected hepatic steatosis. 4. Chronic bilateral L3 pars defects with 3 mm anterolisthesis of L3 on L4 and moderate bilateral foraminal stenosis at L3-4 related to disc osteophyte complex and disc uncovering. 5. Mild degenerative arthropathy of both hips. 6. Small umbilical hernia containing adipose tissue. Electronically signed by: Ryan Salvage MD 07/25/2024 02:37 PM EST RP Workstation: [id number]   Labs:   Basic Metabolic Panel: Recent Labs  Lab 07/25/24 1311 07/26/24 0341 07/26/24 1902 07/27/24 0546 07/28/24 0608  NA 135 137  --  135 138  K 3.9 3.8  --  3.7 3.8  CL 99 101  --  102 103  CO2 25 26  --  23 25  GLUCOSE 117* 113*  --  103* 93  BUN 12 11  --  7 7  CREATININE 1.02 1.14  --  0.91 0.84  CALCIUM  9.3 9.0  --  8.8* 9.1  MG  --   --  2.1 2.7* 2.7*  PHOS  --   --  1.8* 2.6 3.2     CBC: Recent Labs  Lab 07/25/24 1311 07/26/24 0341 07/26/24 1902 07/27/24 0546 07/28/24 0608  WBC 16.7* 17.5* 17.6* 15.1* 8.7  NEUTROABS 13.8*  --  15.3* 12.4* 6.3  HGB 14.9 13.6 13.4 12.7* 13.0  HCT 43.5 40.9 41.0 39.2 38.5*  MCV 91.0 94.9 94.0 94.9 91.4  PLT 189 164 176 150 168         SIGNED:   True Atlas, MD  [facility] 07/29/2024, 12:19 PM Time taking on discharge: 50 minutes

## 2024-07-29 NOTE — Plan of Care (Signed)
" °  Problem: Education: Goal: Knowledge of General Education information will improve Description: Including pain rating scale, medication(s)/side effects and non-pharmacologic comfort measures Outcome: Progressing   Problem: Health Behavior/Discharge Planning: Goal: Ability to manage health-related needs will improve Outcome: Progressing   Problem: Clinical Measurements: Goal: Ability to maintain clinical measurements within normal limits will improve Outcome: Progressing   Problem: Activity: Goal: Risk for activity intolerance will decrease Outcome: Progressing   Problem: Elimination: Goal: Will not experience complications related to urinary retention Outcome: Progressing   Problem: Pain Managment: Goal: General experience of comfort will improve and/or be controlled Outcome: Progressing   Problem: Safety: Goal: Ability to remain free from injury will improve Outcome: Progressing   Problem: Skin Integrity: Goal: Risk for impaired skin integrity will decrease Outcome: Progressing   "

## 2024-07-29 NOTE — Consult Note (Signed)
 Referring Provider: TH Primary Care Physician:  [name], MD Primary Gastroenterologist: [facility]  Reason for Consultation: Diverticulitis  HPI: Adrian Hess is a 44 y.o. adult past medical history of spinal stenosis, hypertension and insomnia presented to the hospital few days ago with abdominal pain.  CT scan showed acute diverticulitis involving long segment up to 12 cm of sigmoid colon without any complication.  Started on antibiotics with significant improvement in symptoms.  GI is consulted for further evaluation and to set up possible outpatient colonoscopy.  Patient seen and examined at bedside.  He is ambulating in the hallway.  He denies any abdominal pain now.  Denies nausea or vomiting.  Denies fever or chills.  Overall feeling well and wants to go home.  He had similar episode of diverticulitis in 2020. No previous colonoscopy.  No family history of colon cancer  Past Medical History:  Diagnosis Date   Hypertension    Insomnia    Wears contact lenses     Past Surgical History:  Procedure Laterality Date   KNEE SURGERY     right meniscus   MANDIBLE FRACTURE SURGERY      Prior to Admission medications  Medication Sig Start Date End Date Taking? Authorizing Provider  acetaminophen  (TYLENOL ) 325 MG tablet Take 2 tablets (650 mg total) by mouth every 6 (six) hours as needed for mild pain (or Fever >/= 101). 04/03/19  Yes Gonfa, Taye T, MD  atorvastatin  (LIPITOR) 20 MG tablet Take 1 tablet (20 mg total) by mouth daily. 06/16/24  Yes Jha, Panav, MD  cetirizine (ZYRTEC) 10 MG tablet Take 10 mg by mouth daily.   Yes [provider]  HYDROcodone -acetaminophen  (NORCO/VICODIN) 5-325 MG tablet Take 1 tablet by mouth every 8 (eight) hours as needed. 07/09/24  Yes [provider]  lisinopril  (ZESTRIL ) 20 MG tablet Take 1 tablet (20 mg total) by mouth daily. 05/06/24  Yes Jha, Panav, MD  cephALEXin  (KEFLEX ) 500 MG capsule Take 500 mg by mouth 4 (four) times  daily. Patient not taking: Reported on 07/25/2024 07/08/24   [provider]    Scheduled Meds:  atorvastatin   20 mg Oral Daily   enoxaparin  (LOVENOX ) injection  40 mg Subcutaneous Daily   lisinopril   20 mg Oral Daily   polyethylene glycol  17 g Oral Daily   Continuous Infusions:  piperacillin -tazobactam (ZOSYN )  IV 3.375 g (07/29/24 0527)   PRN Meds:.acetaminophen  **OR** acetaminophen , bisacodyl , HYDROcodone -acetaminophen , HYDROmorphone  (DILAUDID ) injection, melatonin, ondansetron  **OR** ondansetron  (ZOFRAN ) IV, senna-docusate  Allergies as of 07/25/2024 - Review Complete 07/25/2024  Allergen Reaction Noted   Ciprofloxacin   07/25/2024   Percocet [oxycodone -acetaminophen ] Hypertension 03/13/2017    Family History  Problem Relation Age of Onset   Hypertension Mother    Diabetes Mother    Anxiety disorder Mother    Depression Mother    Diabetes Father    Diabetes Sister    Anxiety disorder Sister    Cancer Maternal Grandmother    Heart disease Neg Hx     Social History   Socioeconomic History   Marital status: Single    Spouse name: Not on file   Number of children: Not on file   Years of education: Not on file   Highest education level: Not on file  Occupational History   Not on file  Tobacco Use   Smoking status: Some Days    Types: Cigars, Cigarettes   Smokeless tobacco: Never   Tobacco comments:    Occasionally 1 or 2 cigarettes  Substance and Sexual Activity   Alcohol use: Yes    Alcohol/week: 5.0 standard drinks of alcohol    Types: 5 Cans of beer per week   Drug use: Yes    Types: Marijuana   Sexual activity: Not on file  Other Topics Concern   Not on file  Social History Narrative   Married, 2 children, surveyor, minerals, 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, exercises regularly in gym, [employer], cardio   Social Drivers of Health   Tobacco Use: High Risk (07/22/2024)   Received from [facility]   Patient History    Smoking Tobacco Use:  Every Day    Smokeless Tobacco Use: Never    Passive Exposure: Never  Financial Resource Strain: Not on file  Food Insecurity: No Food Insecurity (07/27/2024)   Epic    Worried About Programme Researcher, Broadcasting/film/video in the Last Year: Never true    Ran Out of Food in the Last Year: Never true  Transportation Needs: No Transportation Needs (07/27/2024)   Epic    Lack of Transportation (Medical): No    Lack of Transportation (Non-Medical): No  Physical Activity: Not on file  Stress: Not on file  Social Connections: Not on file  Intimate Partner Violence: Not At Risk (07/27/2024)   Epic    Fear of Current or Ex-Partner: No    Emotionally Abused: No    Physically Abused: No    Sexually Abused: No  Depression (PHQ2-9): Low Risk (06/04/2024)   Depression (PHQ2-9)    PHQ-2 Score: 0  Alcohol Screen: Medium Risk (05/06/2024)   Alcohol Screen    Last Alcohol Screening Score (AUDIT): 8  Housing: Low Risk (07/27/2024)   Epic    Unable to Pay for Housing in the Last Year: No    Number of Times Moved in the Last Year: 0    Homeless in the Last Year: No  Utilities: Not At Risk (07/27/2024)   Epic    Threatened with loss of utilities: No  Health Literacy: Not on file    Review of Systems: All negative except as stated above in HPI.  Physical Exam: Vital signs: Vitals:   07/28/24 2004 07/29/24 0523  BP: (!) 143/93 (!) 158/117  Pulse: 84 75  Resp: 18 17  Temp: 98.2 F (36.8 C) 98.6 F (37 C)  SpO2:     Last BM Date : 07/28/24 (per pt) General:   Alert,  Well-developed, well-nourished, pleasant and cooperative in NAD Normocephalic, atraumatic Extraocular movement intact Lungs: No visible respiratory distress Heart:  Regular rate and rhythm; no murmurs, clicks, rubs,  or gallops. Abdomen: Soft, nontender, nondistended, bowel sound present, no peritoneal signs Mood and affect normal Alert and oriented x 3 Rectal:  Deferred  GI:  Lab Results: Recent Labs    07/26/24 1902 07/27/24 0546  07/28/24 0608  WBC 17.6* 15.1* 8.7  HGB 13.4 12.7* 13.0  HCT 41.0 39.2 38.5*  PLT 176 150 168   BMET Recent Labs    07/27/24 0546 07/28/24 0608  NA 135 138  K 3.7 3.8  CL 102 103  CO2 23 25  GLUCOSE 103* 93  BUN 7 7  CREATININE 0.91 0.84  CALCIUM  8.8* 9.1   LFT Recent Labs    07/26/24 1902 07/27/24 0546 07/28/24 0608  PROT 7.6   < > 7.3  ALBUMIN 4.2   < > 3.9  AST 23   < > 22  ALT 36   < > 32  ALKPHOS  81   < > 112  BILITOT 1.9*   < > 0.9  BILIDIR 0.8*  --   --   IBILI 1.1*  --   --    < > = values in this interval not displayed.   PT/INR No results for input(s): LABPROT, INR in the last 72 hours.   Studies/Results: No results found.  Impression/Plan: -Acute uncomplicated diverticulitis involving long segment of sigmoid colon. - Fatty liver.  Normal LFTs.  Recommendations ----------------------------- - Recommend total 2 weeks of antibiotics because of long segment involvement of sigmoid colon.  Recommend high-fiber diet.  Avoid NSAIDs. - Follow-up in GI clinic in 6 weeks after discharge to arrange for outpatient colonoscopy. - Okay to discharge from GI standpoint.  Discussed with Dr. Earley.  GI will sign off.  Call us  back if needed.    LOS: 4 days   Layla Lah  MD, FACP 07/29/2024, 9:11 AM  Contact #  [phone]

## 2024-08-03 ENCOUNTER — Telehealth: Payer: Self-pay

## 2024-08-03 NOTE — Telephone Encounter (Signed)
 Copied from CRM [id number]. Topic: Clinical - Lab/Test Results >> Aug 03, 2024  3:56 PM Travis F wrote: Reason for CRM: Patient is calling in because he received his lab results via Mychart and had some questions about it. Patient is requesting a call back to discuss these results.  Advised patient of results

## 2024-12-03 ENCOUNTER — Ambulatory Visit: Admitting: Family Medicine

## 2025-06-06 ENCOUNTER — Encounter: Admitting: Family Medicine
